# Patient Record
Sex: Female | Born: 1945 | Race: White | Hispanic: No | Marital: Married | Smoking: Never smoker
Health system: Southern US, Community
[De-identification: ages and names within clinical notes are randomized; demographics above are authoritative.]

## PROBLEM LIST (undated history)

## (undated) DIAGNOSIS — M069 Rheumatoid arthritis, unspecified: Secondary | ICD-10-CM

## (undated) DIAGNOSIS — K219 Gastro-esophageal reflux disease without esophagitis: Secondary | ICD-10-CM

## (undated) HISTORY — DX: Gastro-esophageal reflux disease without esophagitis: K21.9

## (undated) HISTORY — DX: Rheumatoid arthritis, unspecified: M06.9

---

## 1978-08-20 HISTORY — PX: ABDOMINAL HYSTERECTOMY: SHX81

## 1986-08-20 HISTORY — PX: LUMBAR DISC SURGERY: SHX700

## 1992-08-20 HISTORY — PX: SPINAL FUSION: SHX223

## 1999-08-24 ENCOUNTER — Encounter: Admission: RE | Admit: 1999-08-24 | Discharge: 1999-08-24 | Payer: Self-pay | Admitting: Family Medicine

## 1999-08-24 ENCOUNTER — Encounter: Payer: Self-pay | Admitting: Family Medicine

## 2001-03-06 ENCOUNTER — Other Ambulatory Visit: Admission: RE | Admit: 2001-03-06 | Discharge: 2001-03-06 | Payer: Self-pay | Admitting: Gynecology

## 2001-03-06 ENCOUNTER — Encounter: Payer: Self-pay | Admitting: Family Medicine

## 2001-03-06 ENCOUNTER — Encounter: Admission: RE | Admit: 2001-03-06 | Discharge: 2001-03-06 | Payer: Self-pay | Admitting: Family Medicine

## 2001-07-03 ENCOUNTER — Emergency Department (HOSPITAL_COMMUNITY): Admission: EM | Admit: 2001-07-03 | Discharge: 2001-07-04 | Payer: Self-pay

## 2003-06-04 ENCOUNTER — Encounter: Payer: Self-pay | Admitting: Family Medicine

## 2003-06-04 ENCOUNTER — Encounter: Admission: RE | Admit: 2003-06-04 | Discharge: 2003-06-04 | Payer: Self-pay | Admitting: Family Medicine

## 2003-07-13 ENCOUNTER — Other Ambulatory Visit: Admission: RE | Admit: 2003-07-13 | Discharge: 2003-07-13 | Payer: Self-pay | Admitting: Gynecology

## 2004-07-12 ENCOUNTER — Ambulatory Visit (HOSPITAL_COMMUNITY): Admission: RE | Admit: 2004-07-12 | Discharge: 2004-07-12 | Payer: Self-pay | Admitting: Family Medicine

## 2004-08-01 ENCOUNTER — Other Ambulatory Visit: Admission: RE | Admit: 2004-08-01 | Discharge: 2004-08-01 | Payer: Self-pay | Admitting: Gynecology

## 2005-08-03 ENCOUNTER — Ambulatory Visit (HOSPITAL_COMMUNITY): Admission: RE | Admit: 2005-08-03 | Discharge: 2005-08-03 | Payer: Self-pay | Admitting: Family Medicine

## 2006-08-05 ENCOUNTER — Ambulatory Visit (HOSPITAL_COMMUNITY): Admission: RE | Admit: 2006-08-05 | Discharge: 2006-08-05 | Payer: Self-pay | Admitting: Gynecology

## 2006-12-05 ENCOUNTER — Ambulatory Visit: Payer: Self-pay | Admitting: Gastroenterology

## 2006-12-19 ENCOUNTER — Ambulatory Visit: Payer: Self-pay | Admitting: Gastroenterology

## 2006-12-19 ENCOUNTER — Encounter (INDEPENDENT_AMBULATORY_CARE_PROVIDER_SITE_OTHER): Payer: Self-pay | Admitting: Gastroenterology

## 2007-08-07 ENCOUNTER — Other Ambulatory Visit: Admission: RE | Admit: 2007-08-07 | Discharge: 2007-08-07 | Payer: Self-pay | Admitting: Gynecology

## 2007-08-07 ENCOUNTER — Ambulatory Visit (HOSPITAL_COMMUNITY): Admission: RE | Admit: 2007-08-07 | Discharge: 2007-08-07 | Payer: Self-pay | Admitting: Gynecology

## 2008-08-16 ENCOUNTER — Ambulatory Visit (HOSPITAL_COMMUNITY): Admission: RE | Admit: 2008-08-16 | Discharge: 2008-08-16 | Payer: Self-pay | Admitting: Gynecology

## 2009-09-30 ENCOUNTER — Ambulatory Visit (HOSPITAL_COMMUNITY): Admission: RE | Admit: 2009-09-30 | Discharge: 2009-09-30 | Payer: Self-pay | Admitting: Gynecology

## 2009-12-05 ENCOUNTER — Encounter (INDEPENDENT_AMBULATORY_CARE_PROVIDER_SITE_OTHER): Payer: Self-pay | Admitting: *Deleted

## 2010-09-19 NOTE — Letter (Signed)
Summary: Colonoscopy Date Change Letter  [facility]  [address] [place], [place] [address]   Phone: [phone]  Fax: [phone]      December 05, 2009 MRN: [id number]   [facility] [address] [place], [place]  [address]   Dear Ms. Guevara,   Previously you were recommended to have a repeat colonoscopy around this time. Your chart was recently reviewed by Dr. Arlyce Dice of [facility]. Follow up colonoscopy is now recommended in 12-2011. This revised recommendation is based on current, nationally recognized guidelines for colorectal cancer screening and polyp surveillance. These guidelines are endorsed by the American Cancer Society, [employer] on Colorectal Cancer as well as numerous other major medical organizations.  Please understand that our recommendation assumes that you do not have any new symptoms such as bleeding, a change in bowel habits, anemia, or significant abdominal discomfort. If you do have any concerning GI symptoms or want to discuss the guideline recommendations, please call to arrange an office visit at your earliest convenience. Otherwise we will keep you in our reminder system and contact you 1-2 months prior to the date listed above to schedule your next colonoscopy.  Thank you,  Barbette Hair. Arlyce Dice, M.D.  [facility] [phone]

## 2011-03-14 ENCOUNTER — Other Ambulatory Visit (HOSPITAL_COMMUNITY): Payer: Self-pay | Admitting: Family Medicine

## 2011-03-14 ENCOUNTER — Other Ambulatory Visit (HOSPITAL_COMMUNITY): Payer: Self-pay | Admitting: Gynecology

## 2011-03-14 DIAGNOSIS — Z1231 Encounter for screening mammogram for malignant neoplasm of breast: Secondary | ICD-10-CM

## 2011-04-10 ENCOUNTER — Ambulatory Visit (HOSPITAL_COMMUNITY)
Admission: RE | Admit: 2011-04-10 | Discharge: 2011-04-10 | Disposition: A | Discharge status: Home / Self Care | Payer: Medicare Other | Source: Ambulatory Visit | Attending: Gynecology | Admitting: Gynecology

## 2011-04-10 ENCOUNTER — Ambulatory Visit (HOSPITAL_COMMUNITY): Payer: BC Managed Care – PPO

## 2011-04-10 DIAGNOSIS — Z1231 Encounter for screening mammogram for malignant neoplasm of breast: Secondary | ICD-10-CM

## 2011-11-06 ENCOUNTER — Encounter: Payer: Self-pay | Admitting: Gastroenterology

## 2012-04-23 ENCOUNTER — Other Ambulatory Visit (HOSPITAL_COMMUNITY): Payer: Self-pay | Admitting: Gynecology

## 2012-04-23 DIAGNOSIS — Z1231 Encounter for screening mammogram for malignant neoplasm of breast: Secondary | ICD-10-CM

## 2012-05-09 ENCOUNTER — Ambulatory Visit (HOSPITAL_COMMUNITY): Payer: Medicare Other

## 2012-05-16 ENCOUNTER — Ambulatory Visit (HOSPITAL_COMMUNITY)
Admission: RE | Admit: 2012-05-16 | Discharge: 2012-05-16 | Disposition: A | Discharge status: Home / Self Care | Payer: Medicare Other | Source: Ambulatory Visit | Attending: Gynecology | Admitting: Gynecology

## 2012-05-16 DIAGNOSIS — Z1231 Encounter for screening mammogram for malignant neoplasm of breast: Secondary | ICD-10-CM | POA: Insufficient documentation

## 2012-11-25 ENCOUNTER — Encounter: Payer: Self-pay | Admitting: Gastroenterology

## 2013-05-06 ENCOUNTER — Other Ambulatory Visit (HOSPITAL_COMMUNITY): Payer: Self-pay | Admitting: Family Medicine

## 2013-05-06 ENCOUNTER — Encounter: Payer: Self-pay | Admitting: Gastroenterology

## 2013-05-06 DIAGNOSIS — Z1231 Encounter for screening mammogram for malignant neoplasm of breast: Secondary | ICD-10-CM

## 2013-05-28 ENCOUNTER — Ambulatory Visit (AMBULATORY_SURGERY_CENTER): Payer: Self-pay | Admitting: *Deleted

## 2013-05-28 ENCOUNTER — Ambulatory Visit (HOSPITAL_COMMUNITY)
Admission: RE | Admit: 2013-05-28 | Discharge: 2013-05-28 | Disposition: A | Discharge status: Home / Self Care | Payer: Medicare Other | Source: Ambulatory Visit | Attending: Family Medicine | Admitting: Family Medicine

## 2013-05-28 VITALS — Ht 63.0 in | Wt 176.4 lb

## 2013-05-28 DIAGNOSIS — Z1231 Encounter for screening mammogram for malignant neoplasm of breast: Secondary | ICD-10-CM | POA: Insufficient documentation

## 2013-05-28 DIAGNOSIS — Z86010 Personal history of colonic polyps: Secondary | ICD-10-CM

## 2013-05-28 MED ORDER — NA SULFATE-K SULFATE-MG SULF 17.5-3.13-1.6 GM/177ML PO SOLN: 1.0000 | Freq: Once | ORAL | Status: DC

## 2013-05-28 NOTE — Progress Notes (Signed)
No allergies to eggs or soy. No problems with anesthesia.  

## 2013-05-29 ENCOUNTER — Encounter: Payer: Self-pay | Admitting: Gastroenterology

## 2013-06-10 ENCOUNTER — Ambulatory Visit (AMBULATORY_SURGERY_CENTER): Payer: Medicare Other | Admitting: Gastroenterology

## 2013-06-10 ENCOUNTER — Encounter: Payer: Self-pay | Admitting: Gastroenterology

## 2013-06-10 VITALS — BP 143/60 | HR 61 | Temp 97.7°F | Resp 20 | Ht 63.0 in | Wt 176.0 lb

## 2013-06-10 DIAGNOSIS — K5730 Diverticulosis of large intestine without perforation or abscess without bleeding: Secondary | ICD-10-CM

## 2013-06-10 DIAGNOSIS — D126 Benign neoplasm of colon, unspecified: Secondary | ICD-10-CM

## 2013-06-10 DIAGNOSIS — Z86010 Personal history of colonic polyps: Secondary | ICD-10-CM

## 2013-06-10 MED ORDER — SODIUM CHLORIDE 0.9 % IV SOLN: 500.0000 mL | INTRAVENOUS | Status: DC

## 2013-06-10 NOTE — Progress Notes (Signed)
Called to room to assist during endoscopic procedure.  Patient ID and intended procedure confirmed with present staff. Received instructions for my participation in the procedure from the performing physician.  

## 2013-06-10 NOTE — Progress Notes (Signed)
Report to pacu rn, vss, bbs=clear 

## 2013-06-10 NOTE — Progress Notes (Signed)
Patient did not experience any of the following events: a burn prior to discharge; a fall within the facility; wrong site/side/patient/procedure/implant event; or a hospital transfer or hospital admission upon discharge from the facility. (G8907) Patient did not have preoperative order for IV antibiotic SSI prophylaxis. (G8918)  

## 2013-06-10 NOTE — Patient Instructions (Signed)
Colon polyp removed,and diverticulosis seen. Handouts given on polyps & diverticulosis.  Resume current medications.  Call us with any questions or concerns. Thank you!!  YOU HAD AN ENDOSCOPIC PROCEDURE TODAY AT THE  ENDOSCOPY CENTER: Refer to the procedure report that was given to you for any specific questions about what was found during the examination.  If the procedure report does not answer your questions, please call your gastroenterologist to clarify.  If you requested that your care partner not be given the details of your procedure findings, then the procedure report has been included in a sealed envelope for you to review at your convenience later.  YOU SHOULD EXPECT: Some feelings of bloating in the abdomen. Passage of more gas than usual.  Walking can help get rid of the air that was put into your GI tract during the procedure and reduce the bloating. If you had a lower endoscopy (such as a colonoscopy or flexible sigmoidoscopy) you may notice spotting of blood in your stool or on the toilet paper. If you underwent a bowel prep for your procedure, then you may not have a normal bowel movement for a few days.  DIET: Your first meal following the procedure should be a light meal and then it is ok to progress to your normal diet.  A half-sandwich or bowl of soup is an example of a good first meal.  Heavy or fried foods are harder to digest and may make you feel nauseous or bloated.  Likewise meals heavy in dairy and vegetables can cause extra gas to form and this can also increase the bloating.  Drink plenty of fluids but you should avoid alcoholic beverages for 24 hours.  ACTIVITY: Your care partner should take you home directly after the procedure.  You should plan to take it easy, moving slowly for the rest of the day.  You can resume normal activity the day after the procedure however you should NOT DRIVE or use heavy machinery for 24 hours (because of the sedation medicines used  during the test).    SYMPTOMS TO REPORT IMMEDIATELY: A gastroenterologist can be reached at any hour.  During normal business hours, 8:30 AM to 5:00 PM Monday through Friday, call [phone].  After hours and on weekends, please call the GI answering service at [phone] who will take a message and have the physician on call contact you.   Following lower endoscopy (colonoscopy or flexible sigmoidoscopy):  Excessive amounts of blood in the stool  Significant tenderness or worsening of abdominal pains  Swelling of the abdomen that is new, acute  Fever of 100F or higher  Following upper endoscopy (EGD)  Vomiting of blood or coffee ground material  New chest pain or pain under the shoulder blades  Painful or persistently difficult swallowing  New shortness of breath  Fever of 100F or higher  Black, tarry-looking stools  FOLLOW UP: If any biopsies were taken you will be contacted by phone or by letter within the next 1-3 weeks.  Call your gastroenterologist if you have not heard about the biopsies in 3 weeks.  Our staff will call the home number listed on your records the next business day following your procedure to check on you and address any questions or concerns that you may have at that time regarding the information given to you following your procedure. This is a courtesy call and so if there is no answer at the home number and we have not heard from you  through the emergency physician on call, we will assume that you have returned to your regular daily activities without incident.  SIGNATURES/CONFIDENTIALITY: You and/or your care partner have signed paperwork which will be entered into your electronic medical record.  These signatures attest to the fact that that the information above on your After Visit Summary has been reviewed and is understood.  Full responsibility of the confidentiality of this discharge information lies with you and/or your care-partner.

## 2013-06-10 NOTE — Op Note (Signed)
[facility] [address] [place], [address]   COLONOSCOPY PROCEDURE REPORT  PATIENT: Vickie White, Vickie White  MR#: [id number] BIRTHDATE: 01/16/1946 , 67  yrs. old GENDER: Female ENDOSCOPIST: Louis Meckel, MD REFERRED BY: PROCEDURE DATE:  06/10/2013 PROCEDURE:   Colonoscopy with cold biopsy polypectomy First Screening Colonoscopy - Avg.  risk and is 50 yrs.  old or older - No.  Prior Negative Screening - Now for repeat screening. N/A  History of Adenoma - Now for follow-up colonoscopy & has been > or = to 3 yrs.  Yes hx of adenoma.  Has been 3 or more years since last colonoscopy.  Polyps Removed Today? Yes. ASA CLASS:   Class II INDICATIONS:Patient's personal history of adenomatous colon polyps. 2009 MEDICATIONS: MAC sedation, administered by CRNA and Propofol (Diprivan) 230 mg IV  DESCRIPTION OF PROCEDURE:   After the risks benefits and alternatives of the procedure were thoroughly explained, informed consent was obtained.  A digital rectal exam revealed no abnormalities of the rectum.   The LB [id number]  endoscope was introduced through the anus and advanced to the cecum, which was identified by both the appendix and ileocecal valve. No adverse events experienced.   The quality of the prep was Suprep good  The instrument was then slowly withdrawn as the colon was fully examined.      COLON FINDINGS: A sessile polyp measuring 2 mm (versus inverted diverticulum) in size was found at the cecum.  A polypectomy was performed with cold forceps.   Moderate diverticulosis was noted in the sigmoid colon.   The colon mucosa was otherwise normal. Retroflexed views revealed no abnormalities. The time to cecum=3 minutes 22 seconds.  Withdrawal time=8 minutes 09 seconds.  The scope was withdrawn and the procedure completed. COMPLICATIONS: There were no complications.  ENDOSCOPIC IMPRESSION: 1.   Sessile polyp measuring 2 mm in size was found at the  cecum; polypectomy was performed with cold forceps 2.   Moderate diverticulosis was noted in the sigmoid colon 3.   The colon mucosa was otherwise normal  RECOMMENDATIONS: If the polyp(s) removed today are proven to be adenomatous (pre-cancerous) polyps, you will need a repeat colonoscopy in 5 years.  Otherwise you should continue to follow colorectal cancer screening guidelines for "routine risk" patients with colonoscopy in remark7 years.  You will receive a letter within 1-2 weeks with the results of your biopsy as well as final recommendations. Please call my office if you have not received a letter after 3 weeks.   eSigned:  Louis Meckel, MD 06/10/2013 2:04 PM   cc: Marlise Eves, NP   PATIENT NAME:  Vickie White, Vickie White MR#: [id number]

## 2013-06-11 ENCOUNTER — Telehealth: Payer: Self-pay

## 2013-06-11 NOTE — Telephone Encounter (Signed)
  Follow up Call-  Call back number 06/10/2013  Post procedure Call Back phone  # [phone]  Permission to leave phone message Yes     Patient questions:  Do you have a fever, pain , or abdominal swelling? no Pain Score  0 *  Have you tolerated food without any problems? yes  Have you been able to return to your normal activities? yes  Do you have any questions about your discharge instructions: Diet   no Medications  no Follow up visit  no  Do you have questions or concerns about your Care? no  Actions: * If pain score is 4 or above: No action needed, pain <4.  Per the pt "you guys have the best staff, everyone was wonderful"  No problems noted. Maw

## 2013-06-16 ENCOUNTER — Encounter: Payer: Self-pay | Admitting: Gastroenterology

## 2015-12-26 ENCOUNTER — Other Ambulatory Visit: Payer: Self-pay

## 2015-12-26 DIAGNOSIS — Z1231 Encounter for screening mammogram for malignant neoplasm of breast: Secondary | ICD-10-CM

## 2015-12-29 ENCOUNTER — Ambulatory Visit: Payer: Self-pay

## 2016-01-09 ENCOUNTER — Ambulatory Visit
Admission: RE | Admit: 2016-01-09 | Discharge: 2016-01-09 | Disposition: A | Discharge status: Home / Self Care | Payer: Medicare Other | Source: Ambulatory Visit

## 2016-01-09 DIAGNOSIS — Z1231 Encounter for screening mammogram for malignant neoplasm of breast: Secondary | ICD-10-CM

## 2018-06-16 ENCOUNTER — Encounter: Payer: Self-pay | Admitting: Gastroenterology

## 2018-08-18 ENCOUNTER — Other Ambulatory Visit: Payer: Self-pay | Admitting: Orthopedic Surgery

## 2018-08-18 DIAGNOSIS — M25561 Pain in right knee: Secondary | ICD-10-CM

## 2018-08-24 ENCOUNTER — Ambulatory Visit
Admission: RE | Admit: 2018-08-24 | Discharge: 2018-08-24 | Disposition: A | Discharge status: Home / Self Care | Payer: Medicare Other | Source: Ambulatory Visit | Attending: Orthopedic Surgery | Admitting: Orthopedic Surgery

## 2018-08-24 DIAGNOSIS — M25561 Pain in right knee: Secondary | ICD-10-CM

## 2018-08-30 ENCOUNTER — Other Ambulatory Visit: Payer: Medicare Other

## 2018-10-17 ENCOUNTER — Emergency Department (HOSPITAL_COMMUNITY)
Admission: EM | Admit: 2018-10-17 | Discharge: 2018-10-17 | Disposition: A | Discharge status: Home / Self Care | Payer: Medicare Other | Attending: Emergency Medicine | Admitting: Emergency Medicine

## 2018-10-17 ENCOUNTER — Emergency Department (HOSPITAL_COMMUNITY): Payer: Medicare Other

## 2018-10-17 DIAGNOSIS — S4992XA Unspecified injury of left shoulder and upper arm, initial encounter: Secondary | ICD-10-CM | POA: Diagnosis present

## 2018-10-17 DIAGNOSIS — W01198A Fall on same level from slipping, tripping and stumbling with subsequent striking against other object, initial encounter: Secondary | ICD-10-CM | POA: Diagnosis not present

## 2018-10-17 DIAGNOSIS — Y998 Other external cause status: Secondary | ICD-10-CM | POA: Diagnosis not present

## 2018-10-17 DIAGNOSIS — W19XXXA Unspecified fall, initial encounter: Secondary | ICD-10-CM

## 2018-10-17 DIAGNOSIS — Y9234 Swimming pool (public) as the place of occurrence of the external cause: Secondary | ICD-10-CM | POA: Diagnosis not present

## 2018-10-17 DIAGNOSIS — Y9389 Activity, other specified: Secondary | ICD-10-CM | POA: Diagnosis not present

## 2018-10-17 DIAGNOSIS — S42202A Unspecified fracture of upper end of left humerus, initial encounter for closed fracture: Secondary | ICD-10-CM | POA: Diagnosis not present

## 2018-10-17 DIAGNOSIS — S52025A Nondisplaced fracture of olecranon process without intraarticular extension of left ulna, initial encounter for closed fracture: Secondary | ICD-10-CM

## 2018-10-17 MED ORDER — ONDANSETRON HCL 4 MG/2ML IJ SOLN
4.0000 mg | Freq: Once | INTRAMUSCULAR | Status: AC
  Administered 2018-10-17: 4 mg via INTRAVENOUS
  Filled 2018-10-17: qty 2

## 2018-10-17 MED ORDER — HYDROCODONE-ACETAMINOPHEN 5-325 MG PO TABS
1.0000 | ORAL_TABLET | Freq: Once | ORAL | Status: AC
  Administered 2018-10-17: 1 via ORAL
  Filled 2018-10-17: qty 1

## 2018-10-17 MED ORDER — NAPROXEN 500 MG PO TABS: 500.0000 mg | ORAL_TABLET | Freq: Two times a day (BID) | ORAL | 0 refills | Status: DC | PRN

## 2018-10-17 MED ORDER — MORPHINE SULFATE (PF) 4 MG/ML IV SOLN
4.0000 mg | Freq: Once | INTRAVENOUS | Status: AC
  Administered 2018-10-17: 4 mg via INTRAVENOUS
  Filled 2018-10-17: qty 1

## 2018-10-17 MED ORDER — HYDROCODONE-ACETAMINOPHEN 5-325 MG PO TABS: 1.0000 | ORAL_TABLET | Freq: Four times a day (QID) | ORAL | 0 refills | Status: DC | PRN

## 2018-10-17 MED ORDER — FENTANYL CITRATE (PF) 100 MCG/2ML IJ SOLN
50.0000 ug | Freq: Once | INTRAMUSCULAR | Status: AC
  Administered 2018-10-17: 50 ug via INTRAVENOUS
  Filled 2018-10-17: qty 2

## 2018-10-17 NOTE — Discharge Instructions (Addendum)
Wear splint and sling at all times until you see the orthopedist. Ice and elevate the elbow and shoulder throughout the day, using ice pack for no more than 20 minutes every hour.  Alternate between naprosyn and norco as directed as needed for pain relief. Do not drive or operate machinery with pain medication use. Follow up with Dr. Tamera Punt this week for ongoing management of your elbow and shoulder fractures. Return to the ER for changes or worsening symptoms.

## 2018-10-17 NOTE — ED Notes (Signed)
Patient verbalizes understanding of discharge instructions. Opportunity for questioning and answers were provided. Armband removed by staff, pt discharged from ED.  

## 2018-10-17 NOTE — ED Provider Notes (Signed)
[facility] EMER[facility]ENCY DEPARTMENT Provider Note   CSN: [id number] Arrival date & time: 10/17/18  1134    History   Chief Complaint Chief Complaint  Patient presents with  . Fall    HPI    Vickie White is a 73 y.o. female with a PMHx of GERD and RA, who presents to the ED with complaints of mechanical fall at the [facility].  Patient states that she got out of the pool, and slipped on the deck, landing on her left elbow and jamming her left shoulder up.  This occurred around 10 AM.  She did not hit her head or lose consciousness, she is not on any blood thinners.  She now complains of 7/10 constant sharp nonradiating left elbow and shoulder pain that worsens with movement and has been improved with fentanyl.  She has intermittent tingling in her left hand that is dependent on positioning.  Her PCP is Dr. Micheal Likens at in [facility].  She sees Dr. Mayer Camel of [place].  She last ate around 8 AM.  She denies any headache, vision changes, lightheadedness, prodromal symptoms, chest pain, shortness breath, abdominal pain, nausea, vomiting, neck or back pain, any other injuries or pain anywhere else, numbness, focal weakness, or any other complaints at this time.  The history is provided by the patient and medical records. No language interpreter was used.  Fall  Pertinent negatives include no chest pain, no abdominal pain, no headaches and no shortness of breath.    Past Medical History:  Diagnosis Date  . GERD (gastroesophageal reflux disease)   . Rheumatoid arthritis     There are no active problems to display for this patient.   Past Surgical History:  Procedure Laterality Date  . ABDOMINAL HYSTERECTOMY  1980  . [place]  . SPINAL FUSION  1994   L4 and L5     OB History   No obstetric history on file.      Home Medications    Prior to Admission medications   Medication Sig Start Date End Date Taking? Authorizing Provider  Multiple  Vitamin (MULTIVITAMIN) tablet Take 1 tablet by mouth daily.    [provider]    Family History Family History  Problem Relation Age of Onset  . Colon cancer Neg Hx     Social History Social History   Tobacco Use  . Smoking status: Never Smoker  . Smokeless tobacco: Never Used  Substance Use Topics  . Alcohol use: No  . Drug use: No     Allergies   Demerol [meperidine] and Penicillins   Review of Systems Review of Systems  HENT: Negative for facial swelling (no head inj).   Respiratory: Negative for shortness of breath.   Cardiovascular: Negative for chest pain.  Gastrointestinal: Negative for abdominal pain, nausea and vomiting.  Musculoskeletal: Positive for arthralgias. Negative for back pain and neck pain.  Allergic/Immunologic: Negative for immunocompromised state.  Neurological: Negative for syncope, weakness, light-headedness, numbness and headaches.  Hematological: Does not bruise/bleed easily.  Psychiatric/Behavioral: Negative for confusion.   All other systems reviewed and are negative for acute change except as noted in the HPI.    Physical Exam Updated Vital Signs BP (!) 155/77 (BP Location: Right Arm)   Pulse 75   Temp (!) 97.5 F (36.4 C) (Oral)   Resp 16   SpO2 100%   Physical Exam Vitals signs and nursing note reviewed.  Constitutional:      General:  She is not in acute distress.    Appearance: Normal appearance. She is well-developed. She is not toxic-appearing.     Comments: Afebrile, nontoxic, NAD  HENT:     Head: Normocephalic and atraumatic.  Eyes:     General:        Right eye: No discharge.        Left eye: No discharge.     Conjunctiva/sclera: Conjunctivae normal.  Neck:     Musculoskeletal: Normal range of motion and neck supple. Normal range of motion. No neck rigidity, spinous process tenderness or muscular tenderness.     Comments: FROM intact without spinous process TTP, no bony stepoffs or deformities, no  paraspinous muscle TTP or muscle spasms. No rigidity or meningeal signs. No bruising or swelling.  Cardiovascular:     Rate and Rhythm: Normal rate and regular rhythm.     Pulses: Normal pulses.     Heart sounds: Normal heart sounds, S1 normal and S2 normal. No murmur. No friction rub. No gallop.   Pulmonary:     Effort: Pulmonary effort is normal. No respiratory distress.     Breath sounds: Normal breath sounds. No decreased breath sounds, wheezing, rhonchi or rales.  Abdominal:     General: Bowel sounds are normal. There is no distension.     Palpations: Abdomen is soft. Abdomen is not rigid.     Tenderness: There is no abdominal tenderness. There is no right CVA tenderness, left CVA tenderness, guarding or rebound. Negative signs include Murphy's sign and McBurney's sign.  Musculoskeletal:     Left shoulder: She exhibits decreased range of motion (due to pain), tenderness and bony tenderness. She exhibits no crepitus, no deformity, normal pulse and normal strength.     Left elbow: She exhibits decreased range of motion. Tenderness found.     Left wrist: Normal.     Comments: No spinal tenderness.  Left shoulder with limited ROM due to pain, diffuse tenderness throughout the proximal humerus, no definite crepitus or deformity although limited exam due to patient not moving the shoulder.  No definite bruising or significant swelling noted.  Moderate tenderness at the elbow diffusely throughout the joint, no swelling or crepitus, no deformity, no bruising.  Limited ROM of the elbow as well.  No tenderness to the forearm or wrist, no tenderness to the hand.  Grip strength and sensation grossly intact, distal pulses intact, soft compartments. No tenderness to the pelvis/hips or lower extremities.   Skin:    General: Skin is warm and dry.     Findings: No rash.  Neurological:     Mental Status: She is alert and oriented to person, place, and time.     Sensory: Sensation is intact. No sensory  deficit.     Motor: Motor function is intact.  Psychiatric:        Mood and Affect: Mood and affect normal.        Behavior: Behavior normal.      ED Treatments / Results  Labs (all labs ordered are listed, but only abnormal results are displayed) Labs Reviewed - No data to display  EKG None  Radiology Dg Chest 1 View  Result Date: 10/17/2018 CLINICAL DATA:  Slipped and fell in water at the [facility]. Pt said she fell on her left elbow and it felt like her humerus "gave way". C/o pain to left elbow, humerus, and left shoulder. Patient's left arm is in an immobilizer, patient unable to move arm. Nonsmoker. Hx of rheumatoid  arthritis. EXAM: CHEST  1 VIEW COMPARISON:  None. FINDINGS: Cardiac silhouette is normal in size. No mediastinal or hilar masses. No evidence of adenopathy. Clear lungs.  No pleural effusion or pneumothorax. Skeletal structures are demineralized. Acute proximal left humeral fracture is again noted. IMPRESSION: No acute cardiopulmonary disease. Electronically Signed   By: Lajean Manes M.D.   On: 10/17/2018 13:14   Dg Elbow Complete Left  Result Date: 10/17/2018 CLINICAL DATA:  Slipped and fell in water at the [facility]. Pt said she fell on her left elbow and it felt like her humerus "gave way". C/o pain to left elbow, humerus, and left shoulder. Patient's left arm is in an immobilizer, patient unable to move arm. Nonsmoker. Hx of rheumatoid arthritis. EXAM: LEFT ELBOW - COMPLETE 3+ VIEW COMPARISON:  None. FINDINGS: There is a fracture of the ulna, across the base of the olecranon, without significant displacement or comminution. No other evidence of a fracture.  Elbow joint is normally aligned. Probable joint effusion. Soft tissues are unremarkable. IMPRESSION: Fracture of the ulna at the base of the olecranon without significant displacement. No dislocation. Electronically Signed   By: Lajean Manes M.D.   On: 10/17/2018 13:13   Dg Shoulder Left  Result Date: 10/17/2018 CLINICAL  DATA:  Slipped and fell in water at the [facility]. Pt said she fell on her left elbow and it felt like her humerus "gave way". C/o pain to left elbow, humerus, and left shoulder. Patient's left arm is in an immobilizer, patient unable to move arm. Nonsmoker. Hx of rheumatoid arthritis. EXAM: LEFT SHOULDER - 2+ VIEW COMPARISON:  None. FINDINGS: There is a comminuted displaced fracture of the proximal humerus. There is a primary transverse fracture across the metaphysis with secondary fractures across the greater tuberosity. The shaft fracture component has displaced superiorly in relation to the humeral head by 2 cm. Is also varus rotation between the humeral head and shaft components. Glenohumeral joint remains normally aligned. AC joint is normally aligned. There are no other fractures. No bone lesions. IMPRESSION: 1. Comminuted, displaced fracture of the proximal left humerus as described. No dislocation. Electronically Signed   By: Lajean Manes M.D.   On: 10/17/2018 13:12   Dg Humerus Left  Result Date: 10/17/2018 CLINICAL DATA:  Slipped and fell in water at the [facility]. Pt said she fell on her left elbow and it felt like her humerus "gave way". C/o pain to left elbow, humerus, and left shoulder. Patient's left arm is in an immobilizer, patient unable to move arm. Nonsmoker. Hx of rheumatoid arthritis. EXAM: LEFT HUMERUS - 2+ VIEW COMPARISON:  None. FINDINGS: There is a comminuted displaced fracture of the proximal humerus. No other humeral fracture. Elbow and shoulder joints are normally aligned. IMPRESSION: Comminuted displaced fracture of the proximal left humerus. No dislocation. Electronically Signed   By: Lajean Manes M.D.   On: 10/17/2018 13:11    Procedures Procedures (including critical care time)     Medications Ordered in ED Medications  fentaNYL (SUBLIMAZE) injection 50 mcg (50 mcg Intravenous Given 10/17/18 1216)  morphine 4 MG/ML injection 4 mg (4 mg Intravenous Given 10/17/18 1408)      Initial Impression / Assessment and Plan / ED Course  I have reviewed the triage vital signs and the nursing notes.  Pertinent labs & imaging results that were available during my care of the patient were reviewed by me and considered in my medical decision making (see chart for details).  73 y.o. female here with mechanical fall, now with left elbow and shoulder pain.  On exam, diffuse left shoulder and upper arm tenderness with some left elbow tenderness, no forearm or wrist tenderness, extremity neurovascularly intact with soft compartments.  X-rays pending.  Will reassess shortly.  Discussed case with my attending Dr. Ralene Bathe who agrees with plan.  1:19 PM Xrays show comminuted displaced fx of proximal L humerus and fracture of ulna at base of olecranon without significant displacement. Spoke with Silvestre Gunner PA-C for orthopedics who will see pt and advise further  2:11 PM Silvestre Gunner PA-C of ortho coming by and advising that she will need arm splint and sling, and f/up with Dr. Tamera Punt this week in the office. Will send home with pain meds. Advised RICE, f/up with ortho this week. I explained the diagnosis and have given explicit precautions to return to the ER including for any other new or worsening symptoms. The patient understands and accepts the medical plan as it's been dictated and I have answered their questions. Discharge instructions concerning home care and prescriptions have been given. The patient is STABLE and is discharged to home in good condition.    Final Clinical Impressions(s) / ED Diagnoses   Final diagnoses:  Closed fracture of proximal end of left humerus, unspecified fracture morphology, initial encounter  Closed nondisplaced fracture of olecranon process of left ulna without intra-articular extension, initial encounter  Fall, initial encounter    ED Discharge Orders         Ordered    naproxen (NAPROSYN) 500 MG tablet  2 times daily PRN      10/17/18 1406    HYDROcodone-acetaminophen (NORCO) 5-325 MG tablet  Every 6 hours PRN     10/17/18 [address], [place], [place] 10/17/18 1418    Quintella Reichert, MD 10/19/18 0900

## 2018-10-17 NOTE — ED Notes (Signed)
Ortho at bedside.

## 2018-10-17 NOTE — ED Triage Notes (Signed)
Pt arrived via [facility] from the [facility] where pt slipped on the deck in the swimming pool area. Pt was walking from pool to hot tub when she fell. Pt denies LOC or striking head. Pt c/o left upper arm and shoulder pain. Arm splinted by EMS. EMS gave 158mcgs fentenyl PTA @10 :50am. VSS. 7/10 pain in left arm at this time.
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
## 2020-01-14 DIAGNOSIS — M069 Rheumatoid arthritis, unspecified: Secondary | ICD-10-CM | POA: Diagnosis not present

## 2020-01-14 DIAGNOSIS — Z139 Encounter for screening, unspecified: Secondary | ICD-10-CM | POA: Diagnosis not present

## 2020-01-14 DIAGNOSIS — Z6823 Body mass index (BMI) 23.0-23.9, adult: Secondary | ICD-10-CM | POA: Diagnosis not present

## 2020-01-14 DIAGNOSIS — Z1331 Encounter for screening for depression: Secondary | ICD-10-CM | POA: Diagnosis not present

## 2020-01-14 DIAGNOSIS — Z9181 History of falling: Secondary | ICD-10-CM | POA: Diagnosis not present

## 2020-02-05 DIAGNOSIS — K529 Noninfective gastroenteritis and colitis, unspecified: Secondary | ICD-10-CM

## 2020-02-05 DIAGNOSIS — I491 Atrial premature depolarization: Secondary | ICD-10-CM | POA: Diagnosis not present

## 2020-02-05 DIAGNOSIS — R0789 Other chest pain: Secondary | ICD-10-CM | POA: Diagnosis not present

## 2020-02-05 DIAGNOSIS — R030 Elevated blood-pressure reading, without diagnosis of hypertension: Secondary | ICD-10-CM | POA: Diagnosis not present

## 2020-02-05 DIAGNOSIS — I161 Hypertensive emergency: Secondary | ICD-10-CM | POA: Diagnosis not present

## 2020-02-05 DIAGNOSIS — R7989 Other specified abnormal findings of blood chemistry: Secondary | ICD-10-CM | POA: Diagnosis not present

## 2020-02-05 DIAGNOSIS — R197 Diarrhea, unspecified: Secondary | ICD-10-CM | POA: Diagnosis not present

## 2020-02-05 DIAGNOSIS — Z8719 Personal history of other diseases of the digestive system: Secondary | ICD-10-CM | POA: Diagnosis not present

## 2020-02-05 DIAGNOSIS — M069 Rheumatoid arthritis, unspecified: Secondary | ICD-10-CM | POA: Diagnosis not present

## 2020-02-05 DIAGNOSIS — R072 Precordial pain: Secondary | ICD-10-CM | POA: Diagnosis not present

## 2020-02-05 DIAGNOSIS — I10 Essential (primary) hypertension: Secondary | ICD-10-CM | POA: Diagnosis not present

## 2020-02-05 DIAGNOSIS — Z885 Allergy status to narcotic agent status: Secondary | ICD-10-CM | POA: Diagnosis not present

## 2020-02-05 DIAGNOSIS — I493 Ventricular premature depolarization: Secondary | ICD-10-CM | POA: Diagnosis not present

## 2020-02-05 DIAGNOSIS — I160 Hypertensive urgency: Secondary | ICD-10-CM

## 2020-02-05 DIAGNOSIS — R791 Abnormal coagulation profile: Secondary | ICD-10-CM | POA: Diagnosis not present

## 2020-02-05 DIAGNOSIS — R079 Chest pain, unspecified: Secondary | ICD-10-CM | POA: Diagnosis not present

## 2020-02-05 DIAGNOSIS — R1111 Vomiting without nausea: Secondary | ICD-10-CM | POA: Diagnosis not present

## 2020-02-05 DIAGNOSIS — Z880 Allergy status to penicillin: Secondary | ICD-10-CM | POA: Diagnosis not present

## 2020-02-05 HISTORY — DX: Hypertensive urgency: I16.0

## 2020-02-05 HISTORY — DX: Noninfective gastroenteritis and colitis, unspecified: K52.9

## 2020-02-05 HISTORY — DX: Chest pain, unspecified: R07.9

## 2020-04-14 ENCOUNTER — Other Ambulatory Visit: Payer: Self-pay | Admitting: Nurse Practitioner

## 2020-04-14 DIAGNOSIS — Z1231 Encounter for screening mammogram for malignant neoplasm of breast: Secondary | ICD-10-CM

## 2020-04-14 DIAGNOSIS — E559 Vitamin D deficiency, unspecified: Secondary | ICD-10-CM | POA: Diagnosis not present

## 2020-04-14 DIAGNOSIS — Z0000 Encounter for general adult medical examination without abnormal findings: Secondary | ICD-10-CM | POA: Diagnosis not present

## 2020-04-14 DIAGNOSIS — E785 Hyperlipidemia, unspecified: Secondary | ICD-10-CM | POA: Diagnosis not present

## 2020-05-04 ENCOUNTER — Other Ambulatory Visit: Payer: Self-pay

## 2020-05-04 ENCOUNTER — Ambulatory Visit
Admission: RE | Admit: 2020-05-04 | Discharge: 2020-05-04 | Disposition: A | Discharge status: Home / Self Care | Payer: Medicare PPO | Source: Ambulatory Visit | Attending: Nurse Practitioner | Admitting: Nurse Practitioner

## 2020-05-04 DIAGNOSIS — Z1231 Encounter for screening mammogram for malignant neoplasm of breast: Secondary | ICD-10-CM | POA: Diagnosis not present

## 2020-06-10 DIAGNOSIS — M791 Myalgia, unspecified site: Secondary | ICD-10-CM | POA: Diagnosis not present

## 2020-06-10 DIAGNOSIS — D649 Anemia, unspecified: Secondary | ICD-10-CM | POA: Diagnosis not present

## 2020-06-10 DIAGNOSIS — M150 Primary generalized (osteo)arthritis: Secondary | ICD-10-CM | POA: Diagnosis not present

## 2020-06-10 DIAGNOSIS — Z6824 Body mass index (BMI) 24.0-24.9, adult: Secondary | ICD-10-CM | POA: Diagnosis not present

## 2020-06-10 DIAGNOSIS — M25561 Pain in right knee: Secondary | ICD-10-CM | POA: Diagnosis not present

## 2020-06-10 DIAGNOSIS — M0579 Rheumatoid arthritis with rheumatoid factor of multiple sites without organ or systems involvement: Secondary | ICD-10-CM | POA: Diagnosis not present

## 2020-07-25 DIAGNOSIS — H524 Presbyopia: Secondary | ICD-10-CM | POA: Diagnosis not present

## 2020-07-25 DIAGNOSIS — H25813 Combined forms of age-related cataract, bilateral: Secondary | ICD-10-CM | POA: Diagnosis not present

## 2020-09-19 DIAGNOSIS — M25561 Pain in right knee: Secondary | ICD-10-CM | POA: Diagnosis not present

## 2020-09-19 DIAGNOSIS — M0579 Rheumatoid arthritis with rheumatoid factor of multiple sites without organ or systems involvement: Secondary | ICD-10-CM | POA: Diagnosis not present

## 2020-09-19 DIAGNOSIS — Z6824 Body mass index (BMI) 24.0-24.9, adult: Secondary | ICD-10-CM | POA: Diagnosis not present

## 2020-09-19 DIAGNOSIS — M150 Primary generalized (osteo)arthritis: Secondary | ICD-10-CM | POA: Diagnosis not present

## 2020-09-19 DIAGNOSIS — M791 Myalgia, unspecified site: Secondary | ICD-10-CM | POA: Diagnosis not present

## 2020-09-19 DIAGNOSIS — D508 Other iron deficiency anemias: Secondary | ICD-10-CM | POA: Diagnosis not present

## 2020-12-21 DIAGNOSIS — M0579 Rheumatoid arthritis with rheumatoid factor of multiple sites without organ or systems involvement: Secondary | ICD-10-CM | POA: Diagnosis not present

## 2020-12-21 DIAGNOSIS — Z6825 Body mass index (BMI) 25.0-25.9, adult: Secondary | ICD-10-CM | POA: Diagnosis not present

## 2020-12-21 DIAGNOSIS — D508 Other iron deficiency anemias: Secondary | ICD-10-CM | POA: Diagnosis not present

## 2020-12-21 DIAGNOSIS — M150 Primary generalized (osteo)arthritis: Secondary | ICD-10-CM | POA: Diagnosis not present

## 2020-12-21 DIAGNOSIS — E663 Overweight: Secondary | ICD-10-CM | POA: Diagnosis not present

## 2020-12-21 DIAGNOSIS — M25561 Pain in right knee: Secondary | ICD-10-CM | POA: Diagnosis not present

## 2020-12-21 DIAGNOSIS — M791 Myalgia, unspecified site: Secondary | ICD-10-CM | POA: Diagnosis not present

## 2021-03-07 DIAGNOSIS — Z6823 Body mass index (BMI) 23.0-23.9, adult: Secondary | ICD-10-CM | POA: Diagnosis not present

## 2021-03-07 DIAGNOSIS — J069 Acute upper respiratory infection, unspecified: Secondary | ICD-10-CM | POA: Diagnosis not present

## 2021-03-23 DIAGNOSIS — M0579 Rheumatoid arthritis with rheumatoid factor of multiple sites without organ or systems involvement: Secondary | ICD-10-CM | POA: Diagnosis not present

## 2021-03-23 DIAGNOSIS — M791 Myalgia, unspecified site: Secondary | ICD-10-CM | POA: Diagnosis not present

## 2021-03-23 DIAGNOSIS — Z6824 Body mass index (BMI) 24.0-24.9, adult: Secondary | ICD-10-CM | POA: Diagnosis not present

## 2021-03-23 DIAGNOSIS — M150 Primary generalized (osteo)arthritis: Secondary | ICD-10-CM | POA: Diagnosis not present

## 2021-03-23 DIAGNOSIS — M25561 Pain in right knee: Secondary | ICD-10-CM | POA: Diagnosis not present

## 2021-03-23 DIAGNOSIS — D508 Other iron deficiency anemias: Secondary | ICD-10-CM | POA: Diagnosis not present

## 2021-06-26 DIAGNOSIS — M25561 Pain in right knee: Secondary | ICD-10-CM | POA: Diagnosis not present

## 2021-06-26 DIAGNOSIS — Z6824 Body mass index (BMI) 24.0-24.9, adult: Secondary | ICD-10-CM | POA: Diagnosis not present

## 2021-06-26 DIAGNOSIS — M791 Myalgia, unspecified site: Secondary | ICD-10-CM | POA: Diagnosis not present

## 2021-06-26 DIAGNOSIS — M0579 Rheumatoid arthritis with rheumatoid factor of multiple sites without organ or systems involvement: Secondary | ICD-10-CM | POA: Diagnosis not present

## 2021-06-26 DIAGNOSIS — D508 Other iron deficiency anemias: Secondary | ICD-10-CM | POA: Diagnosis not present

## 2021-06-26 DIAGNOSIS — M150 Primary generalized (osteo)arthritis: Secondary | ICD-10-CM | POA: Diagnosis not present

## 2021-09-11 ENCOUNTER — Other Ambulatory Visit: Payer: Self-pay | Admitting: Family Medicine

## 2021-09-11 DIAGNOSIS — Z1231 Encounter for screening mammogram for malignant neoplasm of breast: Secondary | ICD-10-CM

## 2021-09-20 ENCOUNTER — Ambulatory Visit
Admission: RE | Admit: 2021-09-20 | Discharge: 2021-09-20 | Disposition: A | Discharge status: Home / Self Care | Payer: Medicare PPO | Source: Ambulatory Visit | Attending: Family Medicine | Admitting: Family Medicine

## 2021-09-20 DIAGNOSIS — Z1231 Encounter for screening mammogram for malignant neoplasm of breast: Secondary | ICD-10-CM

## 2021-10-20 DIAGNOSIS — M25542 Pain in joints of left hand: Secondary | ICD-10-CM | POA: Diagnosis not present

## 2021-10-20 DIAGNOSIS — M25541 Pain in joints of right hand: Secondary | ICD-10-CM | POA: Diagnosis not present

## 2021-10-20 DIAGNOSIS — M069 Rheumatoid arthritis, unspecified: Secondary | ICD-10-CM | POA: Diagnosis not present

## 2021-10-20 DIAGNOSIS — Z6824 Body mass index (BMI) 24.0-24.9, adult: Secondary | ICD-10-CM | POA: Diagnosis not present

## 2022-01-09 DIAGNOSIS — K219 Gastro-esophageal reflux disease without esophagitis: Secondary | ICD-10-CM | POA: Insufficient documentation

## 2022-01-09 DIAGNOSIS — M069 Rheumatoid arthritis, unspecified: Secondary | ICD-10-CM | POA: Insufficient documentation

## 2022-01-09 DIAGNOSIS — D509 Iron deficiency anemia, unspecified: Secondary | ICD-10-CM | POA: Insufficient documentation

## 2022-01-09 DIAGNOSIS — M1991 Primary osteoarthritis, unspecified site: Secondary | ICD-10-CM | POA: Insufficient documentation

## 2022-01-09 HISTORY — DX: Primary osteoarthritis, unspecified site: M19.91

## 2022-01-09 HISTORY — DX: Iron deficiency anemia, unspecified: D50.9

## 2022-01-15 NOTE — Progress Notes (Unsigned)
Cardiology Office Note:    Date:  01/16/2022   ID:  Vickie White, DOB 1945/09/13, MRN [id number]  PCP:  Ocie Doyne., MD  Cardiologist:  Shirlee More, MD   Referring MD: Ocie Doyne., MD  ASSESSMENT:    1. Precordial pain   2. Primary hypertension   3. Rheumatoid arthritis, involving unspecified site, unspecified whether rheumatoid factor present ([place])    PLAN:    In order of problems listed above:  She has a pattern of chronic chest pain that I would describe it as possible angina.  It certainly does not sound to be GI or typical reflux in etiology.  We discussed further evaluation I think she is best served by undergoing cardiac CT for both coronary calcium score as well as the presence or absence and severity of CAD and guide treatment if she were to require revascularization.  She has no dye allergy or renal insufficiency and will be scheduled in the next few weeks and we will work around her upcoming vacation. Trend blood pressure she may require additional treatment Stable managed by rheumatology I will be sure to send a copy of my note to Dr. Amil Amen  Next appointment 3 months   Medication Adjustments/Labs and Tests Ordered: Current medicines are reviewed at length with the patient today.  Concerns regarding medicines are outlined above.  Orders Placed This Encounter  Procedures   CT CORONARY MORPH W/CTA COR W/SCORE W/CA W/CM &/OR WO/CM   Basic Metabolic Panel (BMET)   EKG 12-Lead   Meds ordered this encounter  Medications   metoprolol tartrate (LOPRESSOR) 100 MG tablet    Sig: Take 1 tablet (100 mg total) by mouth once for 1 dose. Please take 2 hours before CT    Dispense:  1 tablet    Refill:  0      Chief complaint: I been having chest pain I am concerned I have unrecognized heart disease  History of Present Illness:    Vickie White is a 76 y.o. female with a history of hypertension and rheumatoid arthritis who is being seen today for the  evaluation of chest pain at the request of Ocie Doyne., MD.  Seen in the emergency room [facility] 02/05/2020 for chest pain.  She had a hypertensive urgency was admitted to the hospital monitored serial cardiac enzymes were normal.  She had a stress myocardial perfusion study with an exercise tolerance of 7 METS normal EKG and blood pressure response rare PVCs and normal myocardial perfusion.  She also had a CTA of the chest performed which showed a subpleural 3 mm nodule right upper lobe no findings of pulmonary embolism and no coronary artery atherosclerosis.  Twelve-lead EKG showed sinus rhythm and ischemic T wave inversions 1 aVL V5 and V6.  I had in the past care for both her mother and father's patient's She has no known history of heart disease congenital rheumatic murmur or atrial fibrillation She has never had pleurisy or pericarditis or pulmonary involvement with her rheumatoid arthritis In the past she told me she had taken high-dose steroids.  She has a long history of intermittent chest discomfort she is attributed to hiatal hernia Is unrelated to meals no associated indigestion or acid blockers to her mouth The episodes are not exertional but are relieved with rest sometimes up to 30 to 45 minutes and also by taking Prilosec as needed Located in the left chest does not radiate to the jaw shoulder or back it  is not pleuritic in nature. Recently the happen more frequently about once a week and are more prolonged and more severe it is made her wonder if she has underlying heart disease It is not occurred at nighttime to awaken her from her sleep She has no exercise intolerance exertional chest pain shortness of breath palpitation or syncope. Past Medical History:  Diagnosis Date   Chest pain 02/05/2020   Gastroenteritis 02/05/2020   GERD (gastroesophageal reflux disease)    Hypertensive urgency 02/05/2020   Iron deficiency anemia 01/09/2022   Primary osteoarthritis 01/09/2022    Rheumatoid arthritis ([place])     Past Surgical History:  Procedure Laterality Date   ABDOMINAL HYSTERECTOMY  1980   LUMBAR [place]   L4 and L5    Current Medications: Current Meds  Medication Sig   metoprolol tartrate (LOPRESSOR) 100 MG tablet Take 1 tablet (100 mg total) by mouth once for 1 dose. Please take 2 hours before CT   Multiple Vitamin (MULTIVITAMIN) tablet Take 1 tablet by mouth daily.   omeprazole (PRILOSEC) 20 MG capsule Take 20 mg by mouth as needed for other. Hiatal hernia     Allergies:   Demerol [meperidine], Penicillamine, Valsartan, and Penicillins   Social History   Socioeconomic History   Marital status: Married    Spouse name: Not on file   Number of children: Not on file   Years of education: Not on file   Highest education level: Not on file  Occupational History   Not on file  Tobacco Use   Smoking status: Never   Smokeless tobacco: Never  Substance and Sexual Activity   Alcohol use: No   Drug use: No   Sexual activity: Not on file  Other Topics Concern   Not on file  Social History Narrative   Not on file   Social Determinants of Health   Financial Resource Strain: Not on file  Food Insecurity: Not on file  Transportation Needs: Not on file  Physical Activity: Not on file  Stress: Not on file  Social Connections: Not on file     Family History: The patient's family history is negative for Colon cancer.  Both parents with heart disease  ROS:   ROS Please see the history of present illness.    She now has a skin side effect of Enbrel all other systems reviewed and are negative.  EKGs/Labs/Other Studies Reviewed:    The following studies were reviewed today:   EKG:  EKG is  ordered today.  The ekg ordered today is personally reviewed and demonstrates sinus rhythm QS in V2 lead placement versus old anteroseptal MI  Recent Labs: 04/14/2020 cholesterol 189 LDL 120 0 03/23/2021 hemoglobin 10.9  creatinine 0.72  Physical Exam:    VS:  BP (!) 158/94   Pulse (!) 101   Ht 5' 3.6" (1.615 m)   Wt 141 lb 1.9 oz (64 kg)   SpO2 99%   BMI 24.53 kg/m     Wt Readings from Last 3 Encounters:  01/16/22 141 lb 1.9 oz (64 kg)  06/10/13 176 lb (79.8 kg)  05/28/13 176 lb 6.4 oz (80 kg)     GEN: She does not look chronically ill or debilitated she appears her age well nourished, well developed in no acute distress HEENT: Normal NECK: No JVD; No carotid bruits LYMPHATICS: No lymphadenopathy CARDIAC: RRR, no murmurs, rubs, gallops RESPIRATORY:  Clear to auscultation without rales, wheezing or rhonchi  ABDOMEN: Soft, non-tender, non-distended MUSCULOSKELETAL:  No edema; No deformity  SKIN: Warm and dry NEUROLOGIC:  Alert and oriented x 3 PSYCHIATRIC:  Normal affect     Signed, Shirlee More, MD  01/16/2022 12:08 PM    [facility]

## 2022-01-16 ENCOUNTER — Encounter: Payer: Self-pay | Admitting: Cardiology

## 2022-01-16 ENCOUNTER — Ambulatory Visit (INDEPENDENT_AMBULATORY_CARE_PROVIDER_SITE_OTHER): Payer: Medicare PPO | Admitting: Cardiology

## 2022-01-16 VITALS — BP 158/94 | HR 101 | Ht 63.6 in | Wt 141.1 lb

## 2022-01-16 DIAGNOSIS — I10 Essential (primary) hypertension: Secondary | ICD-10-CM

## 2022-01-16 DIAGNOSIS — M069 Rheumatoid arthritis, unspecified: Secondary | ICD-10-CM | POA: Diagnosis not present

## 2022-01-16 DIAGNOSIS — R072 Precordial pain: Secondary | ICD-10-CM

## 2022-01-16 MED ORDER — METOPROLOL TARTRATE 100 MG PO TABS: 100.0000 mg | ORAL_TABLET | Freq: Once | ORAL | 0 refills | Status: DC

## 2022-01-16 NOTE — Patient Instructions (Addendum)
Medication Instructions:  Your physician recommends that you continue on your current medications as directed. Please refer to the Current Medication list given to you today.  *If you need a refill on your cardiac medications before your next appointment, please call your pharmacy*   Lab Work:  Labs 1 week before CT: BMP  If you have labs (blood work) drawn today and your tests are completely normal, you will receive your results only by: [place] (if you have MyChart) OR A paper copy in the mail If you have any lab test that is abnormal or we need to change your treatment, we will call you to review the results.   Testing/Procedures:   Your cardiac CT will be scheduled at one of the below locations:   [facility] [address] [place], [place] [address] [phone]  [facility] [address] [place], [place] [address] [phone]  If scheduled at [facility], please arrive at the [facility] En[facility] (Entrance C2) of [facility] 30 minutes prior to test start time. You can use the FREE valet parking offered at entrance C (encouraged to control the heart rate for the test)  Proceed to the [facility] Radiology Department (first floor) to check-in and test prep.  All radiology patients and guests should use entrance C2 at [facility], accessed from [facility], even though the hospital's physical address listed is [address].    If scheduled at [facility], please arrive 15 mins early for check-in and test prep.  Please follow these instructions carefully (unless otherwise directed):  On the Night Before the Test: Be sure to Drink plenty of water. Do not consume any caffeinated/decaffeinated beverages or chocolate 12 hours prior to your test. Do not take any antihistamines 12 hours prior to your test.  On the Day  of the Test: Drink plenty of water until 1 hour prior to the test. Do not eat any food 4 hours prior to the test. You may take your regular medications prior to the test.  Take metoprolol (Lopressor) two hours prior to test.. FEMALES- please wear underwire-free bra if available, avoid dresses & tight clothing      After the Test: Drink plenty of water. After receiving IV contrast, you may experience a mild flushed feeling. This is normal. On occasion, you may experience a mild rash up to 24 hours after the test. This is not dangerous. If this occurs, you can take Benadryl 25 mg and increase your fluid intake. If you experience trouble breathing, this can be serious. If it is severe call 911 IMMEDIATELY. If it is mild, please call our office.  We will call to schedule your test 2-4 weeks out understanding that some insurance companies will need an authorization prior to the service being performed.   For non-scheduling related questions, please contact the cardiac imaging nurse navigator should you have any questions/concerns: Marchia Bond, Cardiac Imaging Nurse Navigator Gordy Clement, Cardiac Imaging Nurse Navigator [facility] Direct Office Dial: [phone]   For scheduling needs, including cancellations and rescheduling, please call [place], [phone].    Follow-Up: At [facility], you and your health needs are our priority.  As part of our continuing mission to provide you with exceptional heart care, we have created designated Provider Care Teams.  These Care Teams include your primary Cardiologist (physician) and Advanced Practice Providers (APPs -  Physician Assistants and Nurse Practitioners) who all work together to provide you with the care you need, when you need it.  We recommend signing up for the patient portal called "MyChart".  Sign up information is provided on this After Visit Summary.  MyChart is used to connect with patients for  Virtual Visits (Telemedicine).  Patients are able to view lab/test results, encounter notes, upcoming appointments, etc.  Non-urgent messages can be sent to your provider as well.   To learn more about what you can do with MyChart, go to [email or web address].    Your next appointment:   3 month(s)  The format for your next appointment:   In Person  Provider:   Shirlee More, MD    Other Instructions None  Important Information About [place]

## 2022-01-23 DIAGNOSIS — M0579 Rheumatoid arthritis with rheumatoid factor of multiple sites without organ or systems involvement: Secondary | ICD-10-CM | POA: Diagnosis not present

## 2022-01-23 DIAGNOSIS — M1991 Primary osteoarthritis, unspecified site: Secondary | ICD-10-CM | POA: Diagnosis not present

## 2022-01-23 DIAGNOSIS — D508 Other iron deficiency anemias: Secondary | ICD-10-CM | POA: Diagnosis not present

## 2022-01-23 DIAGNOSIS — M791 Myalgia, unspecified site: Secondary | ICD-10-CM | POA: Diagnosis not present

## 2022-01-23 DIAGNOSIS — M25561 Pain in right knee: Secondary | ICD-10-CM | POA: Diagnosis not present

## 2022-01-23 DIAGNOSIS — Z6824 Body mass index (BMI) 24.0-24.9, adult: Secondary | ICD-10-CM | POA: Diagnosis not present

## 2022-02-06 ENCOUNTER — Other Ambulatory Visit (HOSPITAL_COMMUNITY): Payer: Medicare PPO

## 2022-02-08 DIAGNOSIS — R072 Precordial pain: Secondary | ICD-10-CM | POA: Diagnosis not present

## 2022-02-09 ENCOUNTER — Telehealth: Payer: Self-pay

## 2022-02-09 LAB — BASIC METABOLIC PANEL
BUN: 10 mg/dL (ref 8–27)
CO2: 24 mmol/L (ref 20–29)
Calcium: 9.2 mg/dL (ref 8.7–10.3)
Chloride: 104 mmol/L (ref 96–106)
Creatinine, Ser: 0.58 mg/dL (ref 0.57–1.00)
Glucose: 82 mg/dL (ref 70–99)
Potassium: 3.8 mmol/L (ref 3.5–5.2)
Sodium: 139 mmol/L (ref 134–144)
eGFR: 93 mL/min/{1.73_m2} (ref >59)

## 2022-02-09 NOTE — Telephone Encounter (Signed)
Patient notified of results.

## 2022-02-15 ENCOUNTER — Telehealth (HOSPITAL_COMMUNITY): Payer: Self-pay | Admitting: Emergency Medicine

## 2022-02-15 NOTE — Telephone Encounter (Signed)
Reaching out to patient to offer assistance regarding upcoming cardiac imaging study; pt verbalizes understanding of appt date/time, parking situation and where to check in, pre-test NPO status and medications ordered, and verified current allergies; name and call back number provided for further questions should they arise Marchia Bond RN Navigator Cardiac Imaging [facility] [phone] office [phone] cell  '100mg'$  metoprolol tartrate Denies iv issues Arrival 1030

## 2022-02-16 ENCOUNTER — Ambulatory Visit (HOSPITAL_COMMUNITY)
Admission: RE | Admit: 2022-02-16 | Discharge: 2022-02-16 | Disposition: A | Discharge status: Home / Self Care | Payer: Medicare PPO | Source: Ambulatory Visit | Attending: Cardiology | Admitting: Cardiology

## 2022-02-16 DIAGNOSIS — R072 Precordial pain: Secondary | ICD-10-CM | POA: Insufficient documentation

## 2022-02-16 MED ORDER — IOHEXOL 350 MG/ML SOLN
100.0000 mL | Freq: Once | INTRAVENOUS | Status: AC | PRN
  Administered 2022-02-16: 100 mL via INTRAVENOUS

## 2022-02-16 MED ORDER — NITROGLYCERIN 0.4 MG SL SUBL
SUBLINGUAL_TABLET | SUBLINGUAL | Status: AC
  Filled 2022-02-16: qty 2

## 2022-02-16 MED ORDER — NITROGLYCERIN 0.4 MG SL SUBL
0.8000 mg | SUBLINGUAL_TABLET | Freq: Once | SUBLINGUAL | Status: AC
  Administered 2022-02-16: 0.8 mg via SUBLINGUAL

## 2022-03-13 ENCOUNTER — Telehealth: Payer: Self-pay | Admitting: Cardiology

## 2022-03-13 NOTE — Telephone Encounter (Signed)
Pt c/o of Chest Pain: STAT if CP now or developed within 24 hours  1. Are you having CP right now? no  2. Are you experiencing any other symptoms (ex. SOB, nausea, vomiting, sweating)? no  3. How long have you been experiencing CP? About 4 days  4. Is your CP continuous or coming and going? Comes and goes   5. Have you taken Nitroglycerin? No  Patient said the pain in her chest feels similar to a pulled muscle across her chest. It feels sore when she puts pressure on her chest.  She first noticed the pain when she leaned across her chest [place]. She said she also notices the pain when she lays on her chest.  She is not sure if she pulled a muscle or if this is heart related. She wanted to know if Dr. Bettina Gavia saw anything abnormal on her recent CT  ?
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
## 2022-04-20 ENCOUNTER — Ambulatory Visit: Payer: Medicare PPO | Attending: Cardiology | Admitting: Cardiology

## 2022-04-20 ENCOUNTER — Encounter: Payer: Self-pay | Admitting: Cardiology

## 2022-04-20 VITALS — BP 148/80 | HR 76 | Ht 64.0 in | Wt 136.4 lb

## 2022-04-20 DIAGNOSIS — R072 Precordial pain: Secondary | ICD-10-CM

## 2022-04-20 DIAGNOSIS — I10 Essential (primary) hypertension: Secondary | ICD-10-CM | POA: Diagnosis not present

## 2022-04-20 NOTE — Patient Instructions (Signed)
Medication Instructions:  Your physician recommends that you continue on your current medications as directed. Please refer to the Current Medication list given to you today.  *If you need a refill on your cardiac medications before your next appointment, please call your pharmacy*   Lab Work: None If you have labs (blood work) drawn today and your tests are completely normal, you will receive your results only by: MyChart Message (if you have MyChart) OR A paper copy in the mail If you have any lab test that is abnormal or we need to change your treatment, we will call you to review the results.   Testing/Procedures: None   Follow-Up: At [facility], you and your health needs are our priority.  As part of our continuing mission to provide you with exceptional heart care, we have created designated Provider Care Teams.  These Care Teams include your primary Cardiologist (physician) and Advanced Practice Providers (APPs -  Physician Assistants and Nurse Practitioners) who all work together to provide you with the care you need, when you need it.  We recommend signing up for the patient portal called "MyChart".  Sign up information is provided on this After Visit Summary.  MyChart is used to connect with patients for Virtual Visits (Telemedicine).  Patients are able to view lab/test results, encounter notes, upcoming appointments, etc.  Non-urgent messages can be sent to your provider as well.   To learn more about what you can do with MyChart, go to [email or web address].    Your next appointment:   Follow up as needed  The format for your next appointment:   In Person  Provider:   Brian Munley, MD    Other Instructions None  Important Information About [place]       

## 2022-04-20 NOTE — Progress Notes (Signed)
Cardiology Office Note:    Date:  04/20/2022   ID:  Vickie White, DOB Nov 11, 1945, MRN [id number]  PCP:  Ocie Doyne., MD  Cardiologist:  Shirlee More, MD    Referring MD: Ocie Doyne., MD    ASSESSMENT:    1. Precordial pain   2. Primary hypertension    PLAN:    In order of problems listed above:  In retrospect her symptoms are musculoskeletal costochondritis she is resolved.  Calcium score of 0 coronary angiography near normal with very mild atherosclerosis noncalcified and the remainder the images reassuring Continue her current antihypertensive   Next appointment: I will plan to see her back again in the future as needed   Medication Adjustments/Labs and Tests Ordered: Current medicines are reviewed at length with the patient today.  Concerns regarding medicines are outlined above.  No orders of the defined types were placed in this encounter.  No orders of the defined types were placed in this encounter.   Chief Complaint  Patient presents with   Follow-up    After cardiac CTA    History of Present Illness:    Vickie White is a 76 y.o. female with a hx of hypertension rheumatoid arthritis last seen 01/16/2022 for chest pain.  She was seen in the emergency room [facility] 02/05/2020 for chest pain.  She had a hypertensive urgency was admitted to the hospital monitored serial cardiac enzymes were normal.   She had a stress myocardial perfusion study with an exercise tolerance of 7 METS normal EKG and blood pressure response rare PVCs and normal myocardial perfusion.  She also had a CTA of the chest performed which showed a subpleural 3 mm nodule right upper lobe no findings of pulmonary embolism and no coronary artery atherosclerosis.  Twelve-lead EKG showed sinus rhythm and ischemic T wave inversions 1 aVL V5 and V6.    Compliance with diet, lifestyle and medications: Yes  In retrospect her initial presentation was typical costochondral pain.  Her  husband had fallen she struggled to lift him and afterwards had point localized sharp pain in the CC J on the left.  Symptoms have resolved We reviewed the results of her cardiac CTA and I would describe it is near normal with minimal coronary atherosclerosis and a calcium score of 0 her lungs are normal despite rheumatoid arthritis she has no valvular calcification and the appearance of her thoracic aorta is normal she is reassured.  She had a cardiac CTA reported 02/16/2022 with a coronary calcium score of 0 noncalcified atherosclerosis proximal right coronary with less than 25% stenosis and no other abnormality on over read of chest CT Past Medical History:  Diagnosis Date   Chest pain 02/05/2020   Gastroenteritis 02/05/2020   GERD (gastroesophageal reflux disease)    Hypertensive urgency 02/05/2020   Iron deficiency anemia 01/09/2022   Primary osteoarthritis 01/09/2022   Rheumatoid arthritis ([place])     Past Surgical History:  Procedure Laterality Date   ABDOMINAL HYSTERECTOMY  1980   LUMBAR [place]   L4 and L5    Current Medications: Current Meds  Medication Sig   folic acid (FOLVITE) 1 MG tablet Take 2 tablets by mouth daily.   methotrexate (RHEUMATREX) 2.5 MG tablet Take 5 tablets by mouth once a week.   Multiple Vitamin (MULTIVITAMIN) tablet Take 1 tablet by mouth daily.   omeprazole (PRILOSEC) 20 MG capsule Take 20 mg by mouth as needed  for other. Hiatal hernia     Allergies:   Demerol [meperidine], Penicillamine, Valsartan, and Penicillins   Social History   Socioeconomic History   Marital status: Married    Spouse name: Not on file   Number of children: Not on file   Years of education: Not on file   Highest education level: Not on file  Occupational History   Not on file  Tobacco Use   Smoking status: Never   Smokeless tobacco: Never  Substance and Sexual Activity   Alcohol use: No   Drug use: No   Sexual activity: Not on file   Other Topics Concern   Not on file  Social History Narrative   Not on file   Social Determinants of Health   Financial Resource Strain: Not on file  Food Insecurity: Not on file  Transportation Needs: Not on file  Physical Activity: Not on file  Stress: Not on file  Social Connections: Not on file     Family History: The patient's family history is negative for Colon cancer. ROS:   Please see the history of present illness.    All other systems reviewed and are negative.  EKGs/Labs/Other Studies Reviewed:    The following studies were reviewed today:   Recent Labs: 02/08/2022: BUN 10; Creatinine, Ser 0.58; Potassium 3.8; Sodium 139  Recent Lipid Panel 04/14/2020 cholesterol 189 LDL 120  Physical Exam:    VS:  BP (!) 148/80 (BP Location: Left Arm, Patient Position: Sitting, Cuff Size: Normal)   Pulse 76   Ht '5\' 4"'$  (1.626 m)   Wt 136 lb 6.4 oz (61.9 kg)   SpO2 99%   BMI 23.41 kg/m     Wt Readings from Last 3 Encounters:  04/20/22 136 lb 6.4 oz (61.9 kg)  01/16/22 141 lb 1.9 oz (64 kg)  06/10/13 176 lb (79.8 kg)     GEN:  Well nourished, well developed in no acute distress HEENT: Normal NECK: No JVD; No carotid bruits LYMPHATICS: No lymphadenopathy CARDIAC: RRR, no murmurs, rubs, gallops she has no chest wall tenderness RESPIRATORY:  Clear to auscultation without rales, wheezing or rhonchi  ABDOMEN: Soft, non-tender, non-distended MUSCULOSKELETAL:  No edema; No deformity  SKIN: Warm and dry NEUROLOGIC:  Alert and oriented x 3 PSYCHIATRIC:  Normal affect    Signed, Shirlee More, MD  04/20/2022 10:22 AM    [facility]

## 2022-04-30 ENCOUNTER — Ambulatory Visit: Payer: Medicare PPO | Admitting: Cardiology

## 2022-05-03 DIAGNOSIS — D508 Other iron deficiency anemias: Secondary | ICD-10-CM | POA: Diagnosis not present

## 2022-05-03 DIAGNOSIS — Z6823 Body mass index (BMI) 23.0-23.9, adult: Secondary | ICD-10-CM | POA: Diagnosis not present

## 2022-05-03 DIAGNOSIS — M791 Myalgia, unspecified site: Secondary | ICD-10-CM | POA: Diagnosis not present

## 2022-05-03 DIAGNOSIS — M1991 Primary osteoarthritis, unspecified site: Secondary | ICD-10-CM | POA: Diagnosis not present

## 2022-05-03 DIAGNOSIS — M0579 Rheumatoid arthritis with rheumatoid factor of multiple sites without organ or systems involvement: Secondary | ICD-10-CM | POA: Diagnosis not present

## 2022-05-03 DIAGNOSIS — M25561 Pain in right knee: Secondary | ICD-10-CM | POA: Diagnosis not present

## 2022-05-07 DIAGNOSIS — Z9181 History of falling: Secondary | ICD-10-CM | POA: Diagnosis not present

## 2022-05-07 DIAGNOSIS — Z139 Encounter for screening, unspecified: Secondary | ICD-10-CM | POA: Diagnosis not present

## 2022-05-07 DIAGNOSIS — B351 Tinea unguium: Secondary | ICD-10-CM | POA: Diagnosis not present

## 2022-05-07 DIAGNOSIS — Z1331 Encounter for screening for depression: Secondary | ICD-10-CM | POA: Diagnosis not present

## 2022-05-07 DIAGNOSIS — Z6822 Body mass index (BMI) 22.0-22.9, adult: Secondary | ICD-10-CM | POA: Diagnosis not present

## 2022-06-11 DIAGNOSIS — Z0000 Encounter for general adult medical examination without abnormal findings: Secondary | ICD-10-CM | POA: Diagnosis not present

## 2022-06-11 DIAGNOSIS — Z6822 Body mass index (BMI) 22.0-22.9, adult: Secondary | ICD-10-CM | POA: Diagnosis not present

## 2022-06-14 DIAGNOSIS — M19012 Primary osteoarthritis, left shoulder: Secondary | ICD-10-CM | POA: Diagnosis not present

## 2022-07-02 DIAGNOSIS — M19122 Post-traumatic osteoarthritis, left elbow: Secondary | ICD-10-CM | POA: Diagnosis not present

## 2022-08-30 DIAGNOSIS — M25512 Pain in left shoulder: Secondary | ICD-10-CM | POA: Diagnosis not present

## 2022-08-30 DIAGNOSIS — M0579 Rheumatoid arthritis with rheumatoid factor of multiple sites without organ or systems involvement: Secondary | ICD-10-CM | POA: Diagnosis not present

## 2022-08-30 DIAGNOSIS — D508 Other iron deficiency anemias: Secondary | ICD-10-CM | POA: Diagnosis not present

## 2022-08-30 DIAGNOSIS — M1991 Primary osteoarthritis, unspecified site: Secondary | ICD-10-CM | POA: Diagnosis not present

## 2022-08-30 DIAGNOSIS — M791 Myalgia, unspecified site: Secondary | ICD-10-CM | POA: Diagnosis not present

## 2022-08-30 DIAGNOSIS — Z6823 Body mass index (BMI) 23.0-23.9, adult: Secondary | ICD-10-CM | POA: Diagnosis not present

## 2022-08-30 DIAGNOSIS — M25561 Pain in right knee: Secondary | ICD-10-CM | POA: Diagnosis not present

## 2022-12-03 DIAGNOSIS — M0579 Rheumatoid arthritis with rheumatoid factor of multiple sites without organ or systems involvement: Secondary | ICD-10-CM | POA: Diagnosis not present

## 2022-12-03 DIAGNOSIS — M791 Myalgia, unspecified site: Secondary | ICD-10-CM | POA: Diagnosis not present

## 2022-12-03 DIAGNOSIS — D508 Other iron deficiency anemias: Secondary | ICD-10-CM | POA: Diagnosis not present

## 2022-12-03 DIAGNOSIS — M25561 Pain in right knee: Secondary | ICD-10-CM | POA: Diagnosis not present

## 2022-12-03 DIAGNOSIS — Z6823 Body mass index (BMI) 23.0-23.9, adult: Secondary | ICD-10-CM | POA: Diagnosis not present

## 2022-12-03 DIAGNOSIS — M25512 Pain in left shoulder: Secondary | ICD-10-CM | POA: Diagnosis not present

## 2022-12-03 DIAGNOSIS — M1991 Primary osteoarthritis, unspecified site: Secondary | ICD-10-CM | POA: Diagnosis not present

## 2022-12-24 DIAGNOSIS — H6993 Unspecified Eustachian tube disorder, bilateral: Secondary | ICD-10-CM | POA: Diagnosis not present

## 2022-12-24 DIAGNOSIS — Z6822 Body mass index (BMI) 22.0-22.9, adult: Secondary | ICD-10-CM | POA: Diagnosis not present

## 2022-12-24 DIAGNOSIS — M25512 Pain in left shoulder: Secondary | ICD-10-CM | POA: Diagnosis not present

## 2023-02-11 DIAGNOSIS — Z6822 Body mass index (BMI) 22.0-22.9, adult: Secondary | ICD-10-CM | POA: Diagnosis not present

## 2023-02-11 DIAGNOSIS — R0602 Shortness of breath: Secondary | ICD-10-CM | POA: Diagnosis not present

## 2023-02-11 DIAGNOSIS — R079 Chest pain, unspecified: Secondary | ICD-10-CM | POA: Diagnosis not present

## 2023-03-04 DIAGNOSIS — M0579 Rheumatoid arthritis with rheumatoid factor of multiple sites without organ or systems involvement: Secondary | ICD-10-CM | POA: Diagnosis not present

## 2023-05-02 DIAGNOSIS — M0579 Rheumatoid arthritis with rheumatoid factor of multiple sites without organ or systems involvement: Secondary | ICD-10-CM | POA: Diagnosis not present

## 2023-05-02 DIAGNOSIS — Z6822 Body mass index (BMI) 22.0-22.9, adult: Secondary | ICD-10-CM | POA: Diagnosis not present

## 2023-06-12 IMAGING — MG MM DIGITAL SCREENING BILAT W/ TOMO AND CAD
8 series · 9 of 24 positions shown · non-contrast
Comparison: Previous exam(s).

CLINICAL DATA: Screening.

EXAM:
DIGITAL SCREENING BILATERAL MAMMOGRAM WITH TOMOSYNTHESIS AND CAD
TECHNIQUE: Bilateral screening digital craniocaudal and mediolateral oblique
mammograms were obtained. Bilateral screening digital breast
tomosynthesis was performed. The images were evaluated with
computer-aided detection.

[L MLO synth-2D]
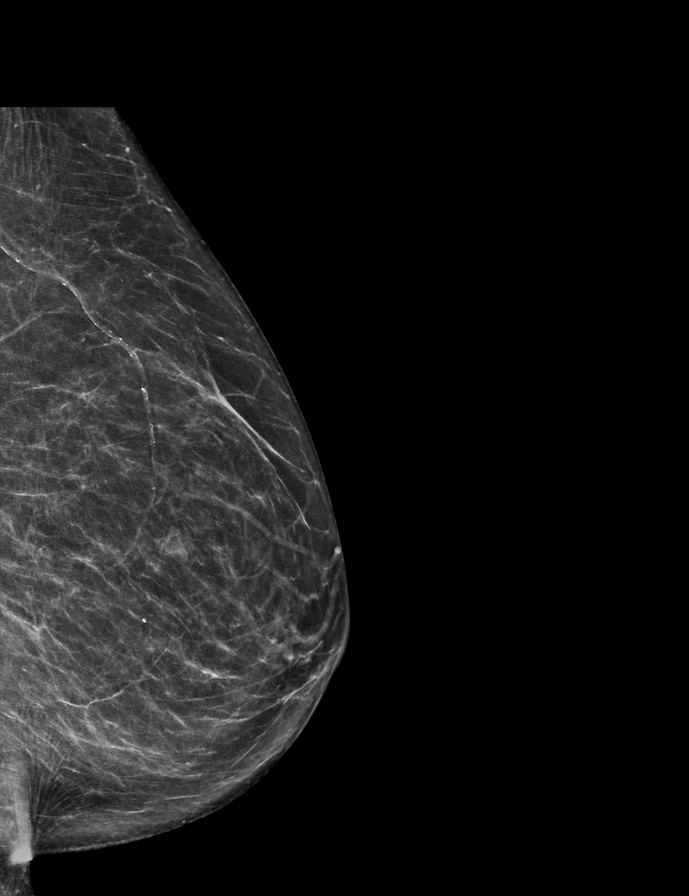

[L CC synth-2D]
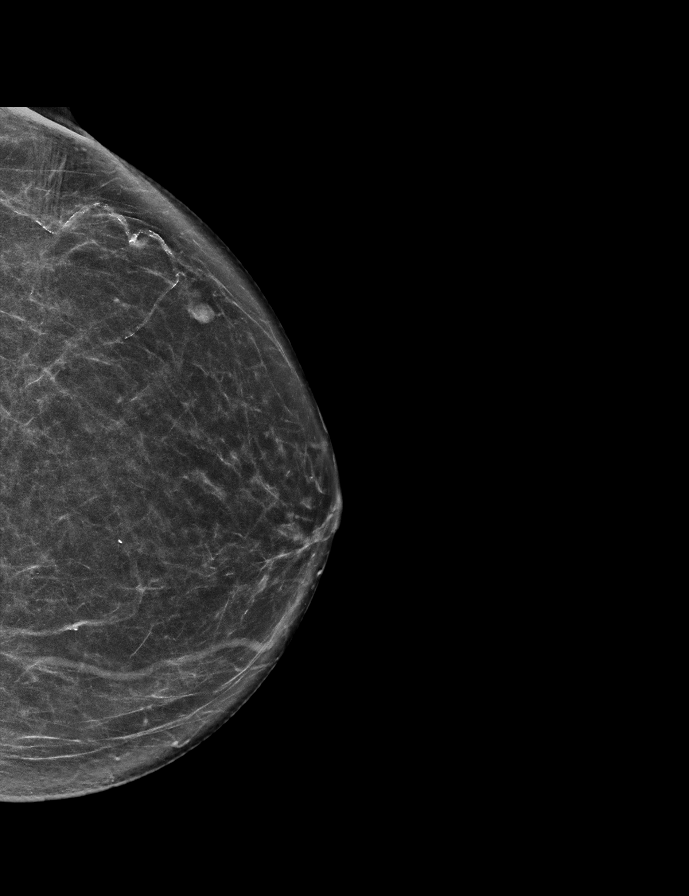

[R MLO synth-2D]
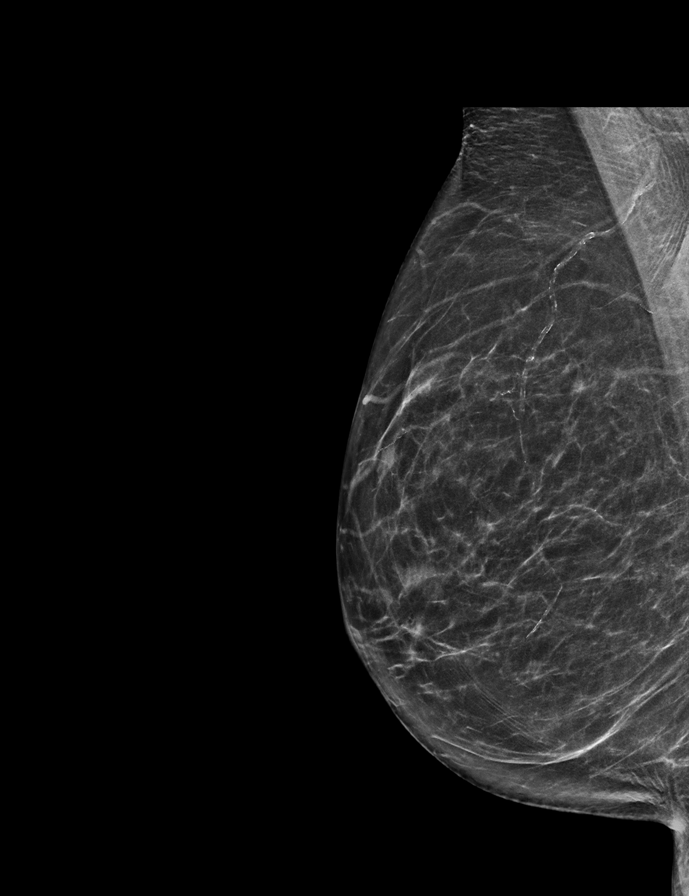

[R CC synth-2D]
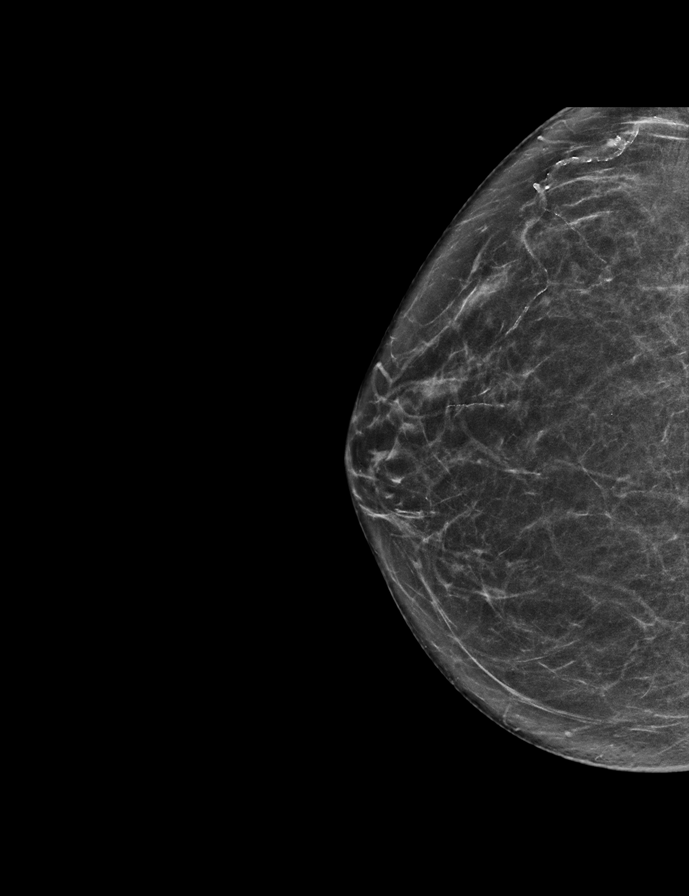

[R CC tomo · 2 of 59 frames shown]
[frame 20/59]
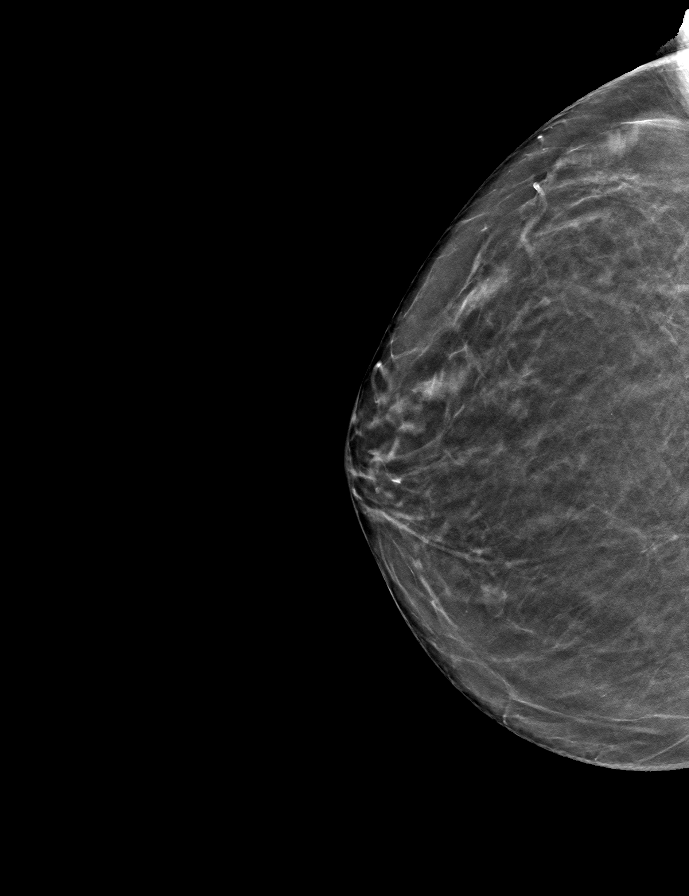
[frame 30/59]
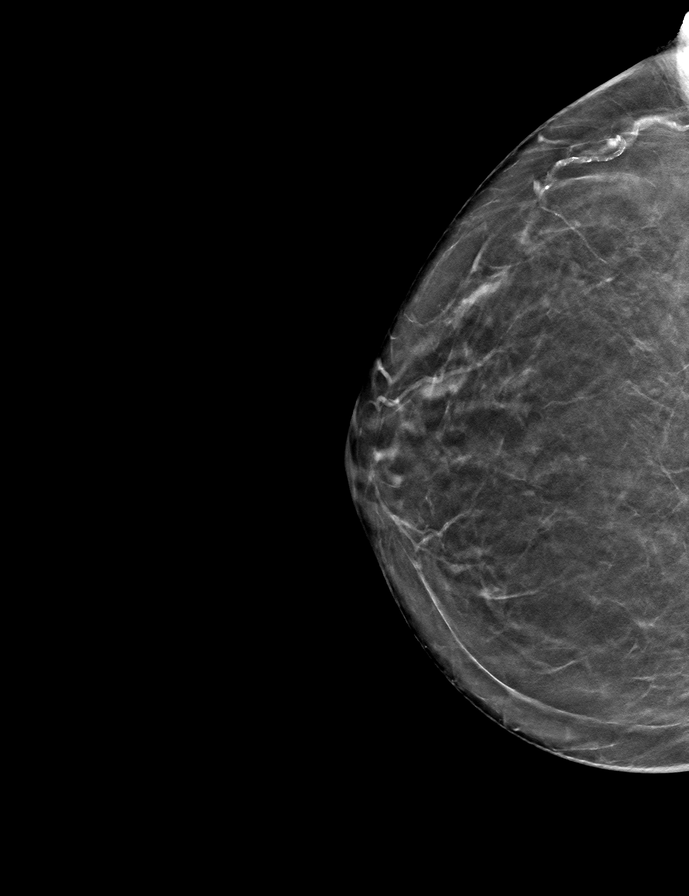

[R MLO tomo · tomo slice 31/61.0]
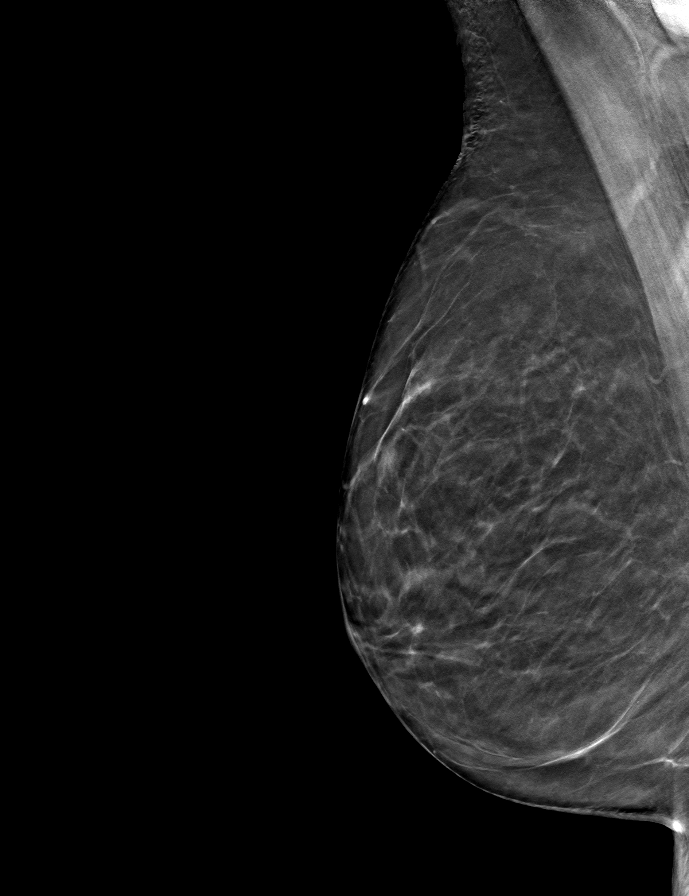

[L CC tomo · tomo slice 32/63.0]
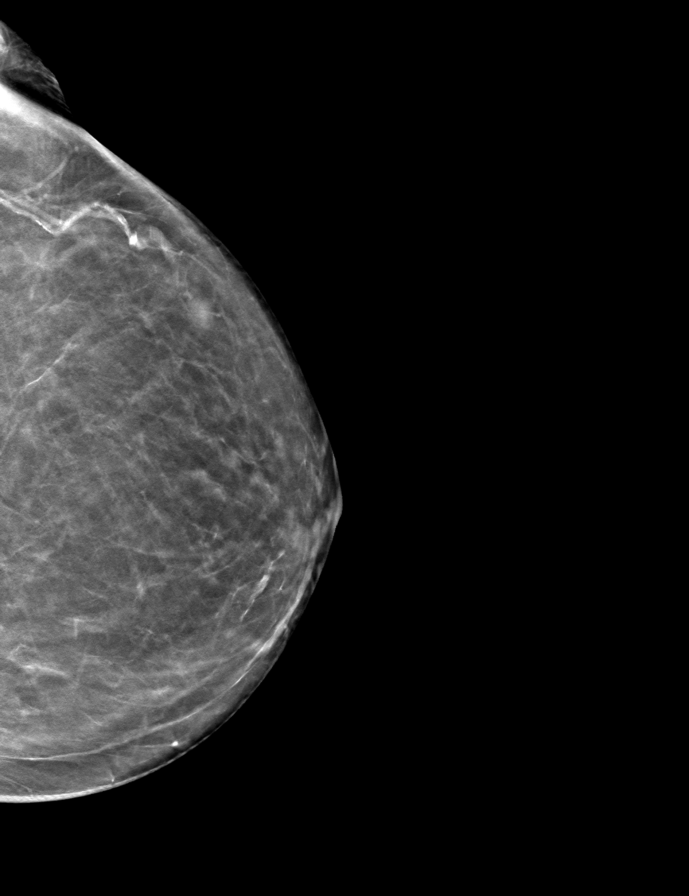

[L MLO tomo · tomo slice 31/60.0]
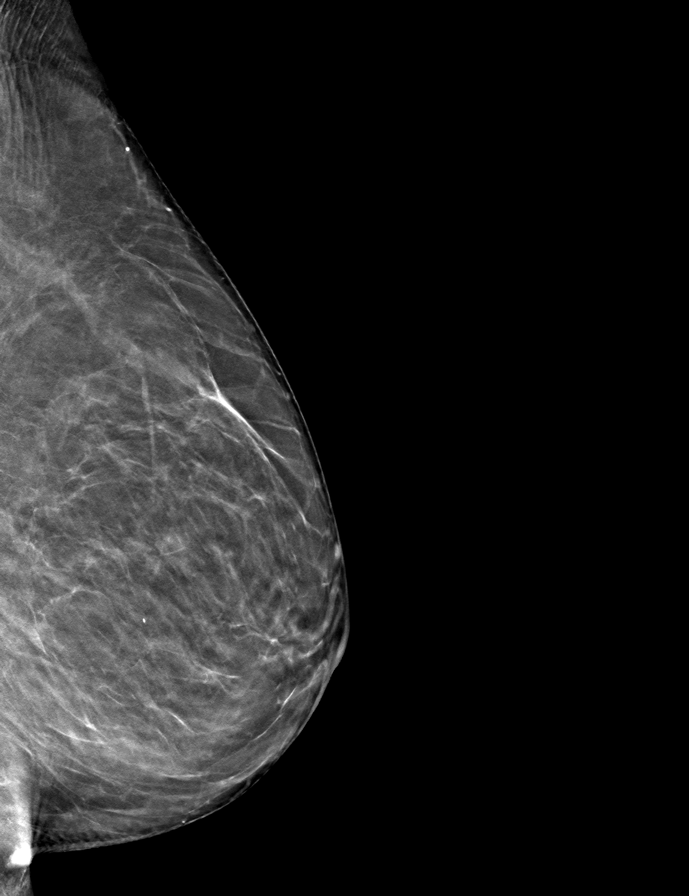

[9 of 24 positions shown; findings below may reference images not displayed]

ACR Breast Density Category b: There are scattered areas of
fibroglandular density.
FINDINGS: There are no findings suspicious for malignancy.
IMPRESSION: No mammographic evidence of malignancy. A result letter of this
screening mammogram will be mailed directly to the patient.

RECOMMENDATION:
Screening mammogram in one year. (Code:51-O-LD2)

BI-RADS CATEGORY  1: Negative.

## 2023-08-06 DIAGNOSIS — Z111 Encounter for screening for respiratory tuberculosis: Secondary | ICD-10-CM | POA: Diagnosis not present

## 2023-08-06 DIAGNOSIS — M25561 Pain in right knee: Secondary | ICD-10-CM | POA: Diagnosis not present

## 2023-08-06 DIAGNOSIS — M791 Myalgia, unspecified site: Secondary | ICD-10-CM | POA: Diagnosis not present

## 2023-08-06 DIAGNOSIS — D508 Other iron deficiency anemias: Secondary | ICD-10-CM | POA: Diagnosis not present

## 2023-08-06 DIAGNOSIS — Z6823 Body mass index (BMI) 23.0-23.9, adult: Secondary | ICD-10-CM | POA: Diagnosis not present

## 2023-08-06 DIAGNOSIS — R5383 Other fatigue: Secondary | ICD-10-CM | POA: Diagnosis not present

## 2023-08-06 DIAGNOSIS — M25512 Pain in left shoulder: Secondary | ICD-10-CM | POA: Diagnosis not present

## 2023-08-06 DIAGNOSIS — M1991 Primary osteoarthritis, unspecified site: Secondary | ICD-10-CM | POA: Diagnosis not present

## 2023-08-06 DIAGNOSIS — M0579 Rheumatoid arthritis with rheumatoid factor of multiple sites without organ or systems involvement: Secondary | ICD-10-CM | POA: Diagnosis not present

## 2023-09-04 DIAGNOSIS — Z9181 History of falling: Secondary | ICD-10-CM | POA: Diagnosis not present

## 2023-09-04 DIAGNOSIS — Z0000 Encounter for general adult medical examination without abnormal findings: Secondary | ICD-10-CM | POA: Diagnosis not present

## 2023-09-10 DIAGNOSIS — H25813 Combined forms of age-related cataract, bilateral: Secondary | ICD-10-CM | POA: Diagnosis not present

## 2023-09-27 DIAGNOSIS — D649 Anemia, unspecified: Secondary | ICD-10-CM | POA: Diagnosis not present

## 2023-09-27 DIAGNOSIS — E559 Vitamin D deficiency, unspecified: Secondary | ICD-10-CM | POA: Diagnosis not present

## 2023-09-27 DIAGNOSIS — E782 Mixed hyperlipidemia: Secondary | ICD-10-CM | POA: Diagnosis not present

## 2023-09-27 DIAGNOSIS — E538 Deficiency of other specified B group vitamins: Secondary | ICD-10-CM | POA: Diagnosis not present

## 2023-09-27 DIAGNOSIS — M0579 Rheumatoid arthritis with rheumatoid factor of multiple sites without organ or systems involvement: Secondary | ICD-10-CM | POA: Diagnosis not present

## 2023-09-27 DIAGNOSIS — Z6822 Body mass index (BMI) 22.0-22.9, adult: Secondary | ICD-10-CM | POA: Diagnosis not present

## 2023-09-27 DIAGNOSIS — N959 Unspecified menopausal and perimenopausal disorder: Secondary | ICD-10-CM | POA: Diagnosis not present

## 2023-10-01 DIAGNOSIS — E538 Deficiency of other specified B group vitamins: Secondary | ICD-10-CM | POA: Diagnosis not present

## 2023-10-15 DIAGNOSIS — E538 Deficiency of other specified B group vitamins: Secondary | ICD-10-CM | POA: Diagnosis not present

## 2023-10-29 DIAGNOSIS — E538 Deficiency of other specified B group vitamins: Secondary | ICD-10-CM | POA: Diagnosis not present

## 2023-11-12 DIAGNOSIS — E538 Deficiency of other specified B group vitamins: Secondary | ICD-10-CM | POA: Diagnosis not present

## 2023-12-24 DIAGNOSIS — M67441 Ganglion, right hand: Secondary | ICD-10-CM | POA: Diagnosis not present

## 2023-12-24 DIAGNOSIS — S91332A Puncture wound without foreign body, left foot, initial encounter: Secondary | ICD-10-CM | POA: Diagnosis not present

## 2023-12-24 DIAGNOSIS — Z6823 Body mass index (BMI) 23.0-23.9, adult: Secondary | ICD-10-CM | POA: Diagnosis not present

## 2024-01-21 DIAGNOSIS — E538 Deficiency of other specified B group vitamins: Secondary | ICD-10-CM | POA: Diagnosis not present

## 2024-02-13 DIAGNOSIS — R233 Spontaneous ecchymoses: Secondary | ICD-10-CM | POA: Diagnosis not present

## 2024-02-13 DIAGNOSIS — Z6823 Body mass index (BMI) 23.0-23.9, adult: Secondary | ICD-10-CM | POA: Diagnosis not present

## 2024-02-24 DIAGNOSIS — D508 Other iron deficiency anemias: Secondary | ICD-10-CM | POA: Diagnosis not present

## 2024-03-30 DIAGNOSIS — D508 Other iron deficiency anemias: Secondary | ICD-10-CM | POA: Diagnosis not present

## 2024-04-01 DIAGNOSIS — Z6823 Body mass index (BMI) 23.0-23.9, adult: Secondary | ICD-10-CM | POA: Diagnosis not present

## 2024-04-01 DIAGNOSIS — F4329 Adjustment disorder with other symptoms: Secondary | ICD-10-CM | POA: Diagnosis not present

## 2024-04-01 DIAGNOSIS — R630 Anorexia: Secondary | ICD-10-CM | POA: Diagnosis not present

## 2024-05-19 DIAGNOSIS — Z6824 Body mass index (BMI) 24.0-24.9, adult: Secondary | ICD-10-CM | POA: Diagnosis not present

## 2024-05-19 DIAGNOSIS — M0579 Rheumatoid arthritis with rheumatoid factor of multiple sites without organ or systems involvement: Secondary | ICD-10-CM | POA: Diagnosis not present

## 2024-05-19 DIAGNOSIS — M25512 Pain in left shoulder: Secondary | ICD-10-CM | POA: Diagnosis not present

## 2024-05-19 DIAGNOSIS — M791 Myalgia, unspecified site: Secondary | ICD-10-CM | POA: Diagnosis not present

## 2024-05-19 DIAGNOSIS — D508 Other iron deficiency anemias: Secondary | ICD-10-CM | POA: Diagnosis not present

## 2024-05-19 DIAGNOSIS — R5383 Other fatigue: Secondary | ICD-10-CM | POA: Diagnosis not present

## 2024-05-19 DIAGNOSIS — M1991 Primary osteoarthritis, unspecified site: Secondary | ICD-10-CM | POA: Diagnosis not present

## 2024-05-19 DIAGNOSIS — M25561 Pain in right knee: Secondary | ICD-10-CM | POA: Diagnosis not present

## 2024-07-13 DIAGNOSIS — Z5320 Procedure and treatment not carried out because of patient's decision for unspecified reasons: Secondary | ICD-10-CM | POA: Diagnosis not present

## 2024-07-13 DIAGNOSIS — E538 Deficiency of other specified B group vitamins: Secondary | ICD-10-CM | POA: Diagnosis not present

## 2024-07-13 DIAGNOSIS — Z6824 Body mass index (BMI) 24.0-24.9, adult: Secondary | ICD-10-CM | POA: Diagnosis not present

## 2024-07-13 DIAGNOSIS — K219 Gastro-esophageal reflux disease without esophagitis: Secondary | ICD-10-CM | POA: Diagnosis not present

## 2024-07-13 DIAGNOSIS — M0579 Rheumatoid arthritis with rheumatoid factor of multiple sites without organ or systems involvement: Secondary | ICD-10-CM | POA: Diagnosis not present

## 2024-07-13 DIAGNOSIS — Z2821 Immunization not carried out because of patient refusal: Secondary | ICD-10-CM | POA: Diagnosis not present

## 2024-07-13 DIAGNOSIS — E782 Mixed hyperlipidemia: Secondary | ICD-10-CM | POA: Diagnosis not present

## 2024-07-13 DIAGNOSIS — E559 Vitamin D deficiency, unspecified: Secondary | ICD-10-CM | POA: Diagnosis not present

## 2024-07-13 DIAGNOSIS — F324 Major depressive disorder, single episode, in partial remission: Secondary | ICD-10-CM | POA: Diagnosis not present

## 2024-07-21 DIAGNOSIS — E538 Deficiency of other specified B group vitamins: Secondary | ICD-10-CM | POA: Diagnosis not present

## 2024-07-28 DIAGNOSIS — M549 Dorsalgia, unspecified: Secondary | ICD-10-CM | POA: Diagnosis not present

## 2024-07-28 DIAGNOSIS — Z6824 Body mass index (BMI) 24.0-24.9, adult: Secondary | ICD-10-CM | POA: Diagnosis not present

## 2024-08-19 ENCOUNTER — Ambulatory Visit: Admitting: Cardiology

## 2024-08-19 ENCOUNTER — Encounter: Payer: Self-pay | Admitting: Cardiology

## 2024-08-19 VITALS — BP 150/100 | HR 118 | Ht 63.6 in | Wt 137.8 lb

## 2024-08-19 DIAGNOSIS — I259 Chronic ischemic heart disease, unspecified: Secondary | ICD-10-CM | POA: Diagnosis not present

## 2024-08-19 DIAGNOSIS — R079 Chest pain, unspecified: Secondary | ICD-10-CM | POA: Diagnosis not present

## 2024-08-19 DIAGNOSIS — E782 Mixed hyperlipidemia: Secondary | ICD-10-CM | POA: Insufficient documentation

## 2024-08-19 DIAGNOSIS — I209 Angina pectoris, unspecified: Secondary | ICD-10-CM | POA: Insufficient documentation

## 2024-08-19 DIAGNOSIS — R030 Elevated blood-pressure reading, without diagnosis of hypertension: Secondary | ICD-10-CM

## 2024-08-19 DIAGNOSIS — I2510 Atherosclerotic heart disease of native coronary artery without angina pectoris: Secondary | ICD-10-CM

## 2024-08-19 DIAGNOSIS — I25119 Atherosclerotic heart disease of native coronary artery with unspecified angina pectoris: Secondary | ICD-10-CM | POA: Diagnosis not present

## 2024-08-19 HISTORY — DX: Atherosclerotic heart disease of native coronary artery without angina pectoris: I25.10

## 2024-08-19 HISTORY — DX: Elevated blood-pressure reading, without diagnosis of hypertension: R03.0

## 2024-08-19 HISTORY — DX: Angina pectoris, unspecified: I20.9

## 2024-08-19 HISTORY — DX: Mixed hyperlipidemia: E78.2

## 2024-08-19 MED ORDER — NITROGLYCERIN 0.4 MG SL SUBL: 0.4000 mg | SUBLINGUAL_TABLET | SUBLINGUAL | 6 refills | Status: AC | PRN

## 2024-08-19 MED ORDER — ASPIRIN 81 MG PO TBEC: 81.0000 mg | DELAYED_RELEASE_TABLET | Freq: Every day | ORAL | Status: AC

## 2024-08-19 MED ORDER — METOPROLOL TARTRATE 100 MG PO TABS: 100.0000 mg | ORAL_TABLET | Freq: Once | ORAL | 0 refills | Status: DC

## 2024-08-19 NOTE — Patient Instructions (Signed)
 Medication Instructions:  Your physician has recommended you make the following change in your medication:   Take 81 mg coated aspirin daily.  Use nitroglycerin  1 tablet placed under the tongue at the first sign of chest pain or an angina attack. 1 tablet may be used every 5 minutes as needed, for up to 15 minutes. Do not take more than 3 tablets in 15 minutes. If pain persist call 911 or go to the nearest ED.   *If you need a refill on your cardiac medications before your next appointment, please call your pharmacy*   Lab Work: Your physician recommends that you return for lab work in: the next few days for CMP, CBC, TSH, vitamin D, vitamin B12 and fasting lipids. You need to have labs done when you are fasting.  You can come Monday through Friday 8:30 am to 12:00 pm and 1:15 to 4:30. You do not need to make an appointment as the order has already been placed.   If you have labs (blood work) drawn today and your tests are completely normal, you will receive your results only by: MyChart Message (if you have MyChart) OR A paper copy in the mail If you have any lab test that is abnormal or we need to change your treatment, we will call you to review the results.   Testing/Procedures:   Your cardiac CT will be scheduled at one of the below locations:   [facility] [address] [place], [place] [phone]  Please follow these instructions carefully (unless otherwise directed):  An IV will be required for this test and Nitroglycerin  will be given.   On the Night Before the Test: Be sure to Drink plenty of water. Do not consume any caffeinated/decaffeinated beverages or chocolate 12 hours prior to your test. Do not take any antihistamines 12 hours prior to your test.   On the Day of the Test: Drink plenty of water until 1 hour prior to the test. Do not eat any food 1 hour prior to test. You may take your regular medications prior to the test.  Take metoprolol   (Lopressor ) 100 mg two hours prior to test. FEMALES- please wear underwire-free bra if available, avoid dresses & tight clothing      After the Test: Drink plenty of water. After receiving IV contrast, you may experience a mild flushed feeling. This is normal. On occasion, you may experience a mild rash up to 24 hours after the test. This is not dangerous. If this occurs, you can take Benadryl 25 mg, Zyrtec, Claritin, or Allegra and increase your fluid intake. (Patients taking Tikosyn should avoid Benadryl, and may take Zyrtec, Claritin, or Allegra) If you experience trouble breathing, this can be serious. If it is severe call 911 IMMEDIATELY. If it is mild, please call our office.  We will call to schedule your test 2-4 weeks out understanding that some insurance companies will need an authorization prior to the service being performed.   For more information and frequently asked questions, please visit our website : [email or web address]  For non-scheduling related questions, please contact the cardiac imaging nurse navigator should you have any questions/concerns: Cardiac Imaging Nurse Navigators Direct Office Dial: [phone]   For scheduling needs, including cancellations and rescheduling, please call Brittany, [phone].    Follow-Up: At [facility], you and your health needs are our priority.  As part of our continuing mission to provide you with exceptional heart care, we have created designated Provider Care Teams.  These  Care Teams include your primary Cardiologist (physician) and Advanced Practice Providers (APPs -  Physician Assistants and Nurse Practitioners) who all work together to provide you with the care you need, when you need it.  We recommend signing up for the patient portal called MyChart.  Sign up information is provided on this After Visit Summary.  MyChart is used to connect with patients for Virtual Visits (Telemedicine).  Patients are  able to view lab/test results, encounter notes, upcoming appointments, etc.  Non-urgent messages can be sent to your provider as well.   To learn more about what you can do with MyChart, go to [email or web address].    Your next appointment:   6 month(s)  The format for your next appointment:   In Person  Provider:   Jennifer Crape, MD    Other Instructions none  Important Information About Sugar

## 2024-08-19 NOTE — Progress Notes (Signed)
 " Cardiology Office Note:    Date:  08/19/2024   ID:  Vickie White, DOB 21-Jun-1946, MRN [id number]  PCP:  Georgia Norleen FALCON., MD  Cardiologist:  Jennifer JONELLE Crape, MD   Referring MD: Georgia Norleen FALCON., MD    ASSESSMENT:    1. Chest pain, unspecified type   2. Coronary artery calcification   3. Elevated blood pressure reading in office without diagnosis of hypertension   4. Angina pectoris   5. Mixed dyslipidemia    PLAN:    In order of problems listed above:  Angina pectoris: I discussed my findings with the patient at length and following recommendations were made.  She was told to take a coated baby aspirin on a daily basis.  Sublingual nitroglycerin  prescription was sent, its protocol and 911 protocol explained and the patient vocalized understanding questions were answered to the patient's satisfaction.  I discussed various modalities of treatment and she prefers CT coronary angiography and we will set this up for her. Elevated blood pressure without diagnosis of hypertension: She does.  Her blood pressure is stable at home she is a little anxious about today's visit.  I reassured her.  Her blood pressure at home ranges in the 120/60 area. Mixed dyslipidemia: Not on statin therapy for unclear reason especially in view of coronary artery calcification.  Will do complete fasting blood work including vitamin D in view of history of deficiency.  We will initiate her on statin once we have these results available. Patient will be seen in follow-up appointment in 6 months or earlier if the patient has any concerns.    Medication Adjustments/Labs and Tests Ordered: Current medicines are reviewed at length with the patient today.  Concerns regarding medicines are outlined above.  Orders Placed This Encounter  Procedures   EKG 12-Lead   No orders of the defined types were placed in this encounter.    No chief complaint on file.    History of Present Illness:    Vickie White is a  78 y.o. female.  Patient has past medical history of coronary artery calcification, mixed dyslipidemia.  Her blood pressure is elevated today.  She has noticed that she has chest tightness in the early hours of the morning.  No orthopnea or PND.  She is an active lady but does not exercise on a regular basis.  At the time of my evaluation, the patient is alert awake oriented and in no distress.  Past Medical History:  Diagnosis Date   Chest pain 02/05/2020   Gastroenteritis 02/05/2020   GERD (gastroesophageal reflux disease)    Hypertensive urgency 02/05/2020   Iron deficiency anemia 01/09/2022   Primary osteoarthritis 01/09/2022   Rheumatoid arthritis ([facility])     Past Surgical History:  Procedure Laterality Date   ABDOMINAL HYSTERECTOMY  1980   LUMBAR DISC SURGERY  1988   SPINAL FUSION  1994   L4 and L5    Current Medications: Active Medications[1]   Allergies:   Demerol [meperidine], Penicillamine, Valsartan, and Penicillins   Social History   Socioeconomic History   Marital status: Married    Spouse name: Not on file   Number of children: Not on file   Years of education: Not on file   Highest education level: Not on file  Occupational History   Not on file  Tobacco Use   Smoking status: Never   Smokeless tobacco: Never  Substance and Sexual Activity   Alcohol use: No   Drug use:  No   Sexual activity: Not on file  Other Topics Concern   Not on file  Social History Narrative   Not on file   Social Drivers of Health   Tobacco Use: Low Risk (08/19/2024)   Patient History    Smoking Tobacco Use: Never    Smokeless Tobacco Use: Never    Passive Exposure: Not on file  Financial Resource Strain: Not on file  Food Insecurity: Not on file  Transportation Needs: Not on file  Physical Activity: Not on file  Stress: Not on file  Social Connections: Not on file  Depression (EYV7-0): Not on file  Alcohol Screen: Not on file  Housing: Not on file  Utilities: Not on file   Health Literacy: Not on file     Family History: The patient's family history is negative for Colon cancer.  ROS:   Please see the history of present illness.    All other systems reviewed and are negative.  EKGs/Labs/Other Studies Reviewed:    The following studies were reviewed today: .SABRAEKG Interpretation Date/Time:  Wednesday August 19 2024 15:35:56 EST Ventricular Rate:  118 PR Interval:  130 QRS Duration:  82 QT Interval:  326 QTC Calculation: 456 R Axis:   79  Text Interpretation: Sinus tachycardia with occasional Premature ventricular complexes Nonspecific ST abnormality Abnormal ECG No previous ECGs available Confirmed by Edwyna Backers [phone]) on 08/19/2024 3:41:40 PM     Recent Labs: No results found for requested labs within last 365 days.  Recent Lipid Panel No results found for: CHOL, TRIG, HDL, CHOLHDL, VLDL, LDLCALC, LDLDIRECT  Physical Exam:    VS:  BP (!) 150/100   Pulse (!) 118   Ht 5' 3.6 (1.615 m)   Wt 137 lb 12.8 oz (62.5 kg)   SpO2 98%   BMI 23.95 kg/m     Wt Readings from Last 3 Encounters:  08/19/24 137 lb 12.8 oz (62.5 kg)  04/20/22 136 lb 6.4 oz (61.9 kg)  01/16/22 141 lb 1.9 oz (64 kg)     GEN: Patient is in no acute distress HEENT: Normal NECK: No JVD; No carotid bruits LYMPHATICS: No lymphadenopathy CARDIAC: Hear sounds regular, 2/6 systolic murmur at the apex. RESPIRATORY:  Clear to auscultation without rales, wheezing or rhonchi  ABDOMEN: Soft, non-tender, non-distended MUSCULOSKELETAL:  No edema; No deformity  SKIN: Warm and dry NEUROLOGIC:  Alert and oriented x 3 PSYCHIATRIC:  Normal affect   Signed, Backers JONELLE Edwyna, MD  08/19/2024 4:01 PM    [facility]     [1]  Current Meds  Medication Sig   Multiple Vitamin (MULTIVITAMIN) tablet Take 1 tablet by mouth daily.   omeprazole (PRILOSEC) 20 MG capsule Take 20 mg by mouth as needed for other. Hiatal hernia   "

## 2024-08-29 ENCOUNTER — Ambulatory Visit: Payer: Self-pay | Admitting: Cardiology

## 2024-08-29 LAB — COMPREHENSIVE METABOLIC PANEL WITH GFR
ALT: 6 IU/L (ref 0–32)
AST: 11 IU/L (ref 0–40)
Albumin: 3.8 g/dL (ref 3.8–4.8)
Alkaline Phosphatase: 93 IU/L (ref 49–135)
BUN/Creatinine Ratio: 13 (ref 12–28)
BUN: 8 mg/dL (ref 8–27)
Bilirubin Total: 0.5 mg/dL (ref 0.0–1.2)
CO2: 23 mmol/L (ref 20–29)
Calcium: 8.9 mg/dL (ref 8.7–10.3)
Chloride: 102 mmol/L (ref 96–106)
Creatinine, Ser: 0.63 mg/dL (ref 0.57–1.00)
Globulin, Total: 2.6 g/dL (ref 1.5–4.5)
Glucose: 82 mg/dL (ref 70–99)
Potassium: 3.8 mmol/L (ref 3.5–5.2)
Sodium: 139 mmol/L (ref 134–144)
Total Protein: 6.4 g/dL (ref 6.0–8.5)
eGFR: 90 mL/min/1.73

## 2024-08-29 LAB — CBC
Hematocrit: 32.4 % — ABNORMAL LOW (ref 34.0–46.6)
Hemoglobin: 10.5 g/dL — ABNORMAL LOW (ref 11.1–15.9)
MCH: 26.1 pg — ABNORMAL LOW (ref 26.6–33.0)
MCHC: 32.4 g/dL (ref 31.5–35.7)
MCV: 80 fL (ref 79–97)
Platelets: 313 x10E3/uL (ref 150–450)
RBC: 4.03 x10E6/uL (ref 3.77–5.28)
RDW: 17.2 % — ABNORMAL HIGH (ref 11.7–15.4)
WBC: 6.8 x10E3/uL (ref 3.4–10.8)

## 2024-08-29 LAB — LIPID PANEL
Chol/HDL Ratio: 2.7 ratio (ref 0.0–4.4)
Cholesterol, Total: 206 mg/dL — ABNORMAL HIGH (ref 100–199)
HDL: 77 mg/dL
LDL Chol Calc (NIH): 121 mg/dL — ABNORMAL HIGH (ref 0–99)
Triglycerides: 43 mg/dL (ref 0–149)
VLDL Cholesterol Cal: 8 mg/dL (ref 5–40)

## 2024-08-29 LAB — VITAMIN B12: Vitamin B-12: 478 pg/mL (ref 232–1245)

## 2024-08-29 LAB — VITAMIN D 25 HYDROXY (VIT D DEFICIENCY, FRACTURES): Vit D, 25-Hydroxy: 23.2 ng/mL — ABNORMAL LOW (ref 30.0–100.0)

## 2024-08-29 LAB — TSH: TSH: 1.49 u[IU]/mL (ref 0.450–4.500)

## 2024-08-31 ENCOUNTER — Other Ambulatory Visit: Payer: Self-pay

## 2024-08-31 ENCOUNTER — Observation Stay (HOSPITAL_BASED_OUTPATIENT_CLINIC_OR_DEPARTMENT_OTHER)
Admission: EM | Admit: 2024-08-31 | Discharge: 2024-09-02 | Disposition: A | Attending: Emergency Medicine | Admitting: Emergency Medicine

## 2024-08-31 ENCOUNTER — Emergency Department (HOSPITAL_BASED_OUTPATIENT_CLINIC_OR_DEPARTMENT_OTHER)

## 2024-08-31 ENCOUNTER — Telehealth: Payer: Self-pay | Admitting: Cardiology

## 2024-08-31 ENCOUNTER — Encounter (HOSPITAL_BASED_OUTPATIENT_CLINIC_OR_DEPARTMENT_OTHER): Payer: Self-pay | Admitting: Emergency Medicine

## 2024-08-31 ENCOUNTER — Telehealth: Payer: Self-pay

## 2024-08-31 DIAGNOSIS — M069 Rheumatoid arthritis, unspecified: Secondary | ICD-10-CM | POA: Diagnosis present

## 2024-08-31 DIAGNOSIS — R079 Chest pain, unspecified: Secondary | ICD-10-CM | POA: Diagnosis present

## 2024-08-31 DIAGNOSIS — E782 Mixed hyperlipidemia: Secondary | ICD-10-CM | POA: Diagnosis not present

## 2024-08-31 DIAGNOSIS — I10 Essential (primary) hypertension: Secondary | ICD-10-CM | POA: Diagnosis not present

## 2024-08-31 DIAGNOSIS — Z79899 Other long term (current) drug therapy: Secondary | ICD-10-CM | POA: Diagnosis not present

## 2024-08-31 DIAGNOSIS — K219 Gastro-esophageal reflux disease without esophagitis: Secondary | ICD-10-CM | POA: Diagnosis not present

## 2024-08-31 DIAGNOSIS — I25119 Atherosclerotic heart disease of native coronary artery with unspecified angina pectoris: Secondary | ICD-10-CM | POA: Insufficient documentation

## 2024-08-31 DIAGNOSIS — Z7982 Long term (current) use of aspirin: Secondary | ICD-10-CM | POA: Insufficient documentation

## 2024-08-31 DIAGNOSIS — D509 Iron deficiency anemia, unspecified: Secondary | ICD-10-CM | POA: Diagnosis present

## 2024-08-31 DIAGNOSIS — I209 Angina pectoris, unspecified: Secondary | ICD-10-CM | POA: Diagnosis present

## 2024-08-31 DIAGNOSIS — I200 Unstable angina: Principal | ICD-10-CM

## 2024-08-31 LAB — CBC
HCT: 33.3 % — ABNORMAL LOW (ref 36.0–46.0)
Hemoglobin: 10.6 g/dL — ABNORMAL LOW (ref 12.0–15.0)
MCH: 25.5 pg — ABNORMAL LOW (ref 26.0–34.0)
MCHC: 31.8 g/dL (ref 30.0–36.0)
MCV: 80.2 fL (ref 80.0–100.0)
Platelets: 353 K/uL (ref 150–400)
RBC: 4.15 MIL/uL (ref 3.87–5.11)
RDW: 17 % — ABNORMAL HIGH (ref 11.5–15.5)
WBC: 7.1 K/uL (ref 4.0–10.5)
nRBC: 0 % (ref 0.0–0.2)

## 2024-08-31 LAB — BASIC METABOLIC PANEL WITH GFR
Anion gap: 11 (ref 5–15)
BUN: 10 mg/dL (ref 8–23)
CO2: 25 mmol/L (ref 22–32)
Calcium: 9.2 mg/dL (ref 8.9–10.3)
Chloride: 105 mmol/L (ref 98–111)
Creatinine, Ser: 0.57 mg/dL (ref 0.44–1.00)
GFR, Estimated: >60 mL/min
Glucose, Bld: 82 mg/dL (ref 70–99)
Potassium: 3.4 mmol/L — ABNORMAL LOW (ref 3.5–5.1)
Sodium: 140 mmol/L (ref 135–145)

## 2024-08-31 LAB — TROPONIN T, HIGH SENSITIVITY
Troponin T High Sensitivity: <15 ng/L (ref 0–19)
Troponin T High Sensitivity: <15 ng/L (ref 0–19)

## 2024-08-31 MED ORDER — METOPROLOL TARTRATE 25 MG PO TABS
100.0000 mg | ORAL_TABLET | Freq: Once | ORAL | Status: AC
  Administered 2024-08-31: 100 mg via ORAL
  Filled 2024-08-31: qty 4

## 2024-08-31 MED ORDER — ASPIRIN 81 MG PO CHEW
81.0000 mg | CHEWABLE_TABLET | Freq: Once | ORAL | Status: AC
  Administered 2024-08-31: 81 mg via ORAL
  Filled 2024-08-31: qty 1

## 2024-08-31 MED ORDER — HEPARIN SODIUM (PORCINE) 5000 UNIT/ML IJ SOLN: 60.0000 [IU]/kg | Freq: Once | INTRAMUSCULAR | Status: DC

## 2024-08-31 MED ORDER — HEPARIN BOLUS VIA INFUSION
3800.0000 [IU] | Freq: Once | INTRAVENOUS | Status: AC
  Administered 2024-08-31: 3800 [IU] via INTRAVENOUS

## 2024-08-31 MED ORDER — HEPARIN (PORCINE) 25000 UT/250ML-% IV SOLN
950.0000 [IU]/h | INTRAVENOUS | Status: DC
  Administered 2024-08-31: 750 [IU]/h via INTRAVENOUS
  Filled 2024-08-31 (×3): qty 250

## 2024-08-31 MED ORDER — HEPARIN (PORCINE) 25000 UT/250ML-% IV SOLN: 12.0000 [IU]/kg/h | INTRAVENOUS | Status: DC

## 2024-08-31 MED ORDER — NITROGLYCERIN 0.4 MG SL SUBL
0.4000 mg | SUBLINGUAL_TABLET | SUBLINGUAL | Status: DC | PRN
  Administered 2024-08-31 – 2024-09-01 (×4): 0.4 mg via SUBLINGUAL
  Filled 2024-08-31 (×3): qty 1

## 2024-08-31 MED ORDER — PANTOPRAZOLE SODIUM 40 MG PO TBEC
40.0000 mg | DELAYED_RELEASE_TABLET | Freq: Every day | ORAL | Status: DC
  Administered 2024-08-31 – 2024-09-02 (×2): 40 mg via ORAL
  Filled 2024-08-31 (×3): qty 1

## 2024-08-31 NOTE — ED Provider Notes (Signed)
 " [facility] Provider Note   CSN: [id number] Arrival date & time: 08/31/24  1109     Patient presents with: Chest Pain   Chest Pain  79 year old female with a history of hypertension, hyperlipidemia, coronary artery calcification, angina presents with chest pain.  She states that yesterday morning she woke up and developed a chest pressure.  She took a nitroglycerin  and it was relieved within moments.  This morning she woke up and experienced the same chest pressure.  It is a moderate sensation all over her chest with no radiation.  No sweating, nausea, shortness of breath, dizziness.  She took a nitroglycerin  but it did not help this morning.  The pain eventually abated after 3 hours and she is currently pain-free.  Patient took her baby aspirin  this morning.  She saw her cardiologist on December 31 and was advised to start a baby aspirin , continue nitroglycerin , and plan for CT coronary angiography as well as start an exercise regimen.  She has been walking daily for the past week or more without symptoms.  Non-smoker with no early family history of cardiovascular disease.    Prior to Admission medications  Medication Sig Start Date End Date Taking? Authorizing Provider  aspirin  EC 81 MG tablet Take 1 tablet (81 mg total) by mouth daily. Swallow whole. 08/19/24   Revankar, Jennifer SAUNDERS, MD  metoprolol  tartrate (LOPRESSOR ) 100 MG tablet Take 1 tablet (100 mg total) by mouth once. Take 2 hours prior to your CT if your heart rate is greater than 55 08/19/24 08/19/24  Revankar, Jennifer SAUNDERS, MD  Multiple Vitamin (MULTIVITAMIN) tablet Take 1 tablet by mouth daily.    [provider]  nitroGLYCERIN  (NITROSTAT ) 0.4 MG SL tablet Place 1 tablet (0.4 mg total) under the tongue every 5 (five) minutes as needed. 08/19/24 11/17/24  Revankar, Jennifer SAUNDERS, MD  omeprazole (PRILOSEC) 20 MG capsule Take 20 mg by mouth as needed for other. Hiatal hernia 12/21/21    [provider]    Allergies: Demerol [meperidine], Penicillamine, Valsartan, and Penicillins    Review of Systems  Cardiovascular:  Positive for chest pain.    Updated Vital Signs BP (!) 152/75   Pulse 78   Temp 98 F (36.7 C)   Resp 10   Ht 5' 3.5 (1.613 m)   Wt 62.5 kg   SpO2 100%   BMI 24.03 kg/m   Physical Exam   Constitutional: Patient appears well-developed and well-nourished. No distress.  HENT:  Head: Normocephalic and atraumatic.  Right Ear: External ear normal.  Left Ear: External ear normal.  Mouth/Throat: Oropharynx is clear and moist. No oropharyngeal exudate.  Eyes: Conjunctivae are normal. Pupils are equal, round, and reactive to light. Neck: Normal range of motion. Neck supple.  Cardiovascular: Normal rate, regular rhythm, normal heart sounds and intact distal pulses.   Pulmonary/Chest: Effort normal and breath sounds normal. No respiratory distress. No wheezes or rales.  Abdominal: Soft. Bowel sounds are normal. No distension or tenderness.  Musculoskeletal: Normal range of motion. No edema or tenderness.  Neurological: Patient is alert with normal muscle tone. Coordination normal.  Skin: Skin is warm and dry. No diaphoresis.  Psychiatric: Normal mood and affect. Normal behavior. Judgment and thought content normal.  Nursing note and vitals reviewed.  (all labs ordered are listed, but only abnormal results are displayed) Labs Reviewed  BASIC METABOLIC PANEL WITH GFR - Abnormal; Notable for the following components:      Result  Value   Potassium 3.4 (*)    All other components within normal limits  CBC - Abnormal; Notable for the following components:   Hemoglobin 10.6 (*)    HCT 33.3 (*)    MCH 25.5 (*)    RDW 17.0 (*)    All other components within normal limits  TROPONIN T, HIGH SENSITIVITY  TROPONIN T, HIGH SENSITIVITY    EKG: EKG Interpretation Date/Time:  Monday August 31 2024 11:21:07 EST Ventricular Rate:  71 PR  Interval:  142 QRS Duration:  91 QT Interval:  387 QTC Calculation: 421 R Axis:   41  Text Interpretation: Sinus rhythm , normal axis, normal intervals, no acute ischemia Confirmed by Arohi Salvatierra ([id number]) on 08/31/2024 12:57:28 PM  Radiology: No results found.  Procedures   Medications Ordered in the ED  aspirin  chewable tablet 81 mg (has no administration in time range)   This patient presents to the ED with chief complaint(s) of chest pain with pertinent past medical history of hypertension, hyperlipidemia, RA not on treatment. The complaint involves an extensive differential diagnosis and also carries with it a high risk of complications and morbidity.    Additional history obtained from family. I have also reviewed previous admission documents and recent cardiology note.  The differential diagnosis includes acute coronary syndrome, PE, pneumothorax, dissection, pneumonia, chest wall pain, costochondritis.  The initial management included labs, EKG, chest x-ray.  EKG is normal sinus rhythm with no acute ischemia.  Initial troponin less than 15.  Slight anemia at baseline.  Borderline potassium.  Chest x-ray official reading pending but negative by my interpretation.  Given second baby aspirin  as she took 1 this morning.  Consultation: - Consulted or discussed management/test interpretation with external professional: Cardiology recommends admit to hospitalist and start heparin  drip.  Patient is amenable to this.  She remains pain-free.  Discussed with Dr. Claudene, hospitalist who will admit patient.  Consideration for admission or further workup: Warrants admission  Social Determinants of health: None     Final diagnoses:  Unstable angina [facility])    ED Discharge Orders     None          Fredia Rosette Kirsch, MD 08/31/24 1450  "

## 2024-08-31 NOTE — Telephone Encounter (Signed)
 Dr. Liborio spoke with MD.

## 2024-08-31 NOTE — Telephone Encounter (Signed)
 ER dr is calling to speak with our dr. Please advise

## 2024-08-31 NOTE — Progress Notes (Signed)
 PHARMACY - ANTICOAGULATION CONSULT NOTE  Pharmacy Consult for heparin  Indication: chest pain/ACS  Allergies[1]  Patient Measurements: Height: 5' 3.5 (161.3 cm) Weight: 62.5 kg (137 lb 12.8 oz) IBW/kg (Calculated) : 53.55 HEPARIN  DW (KG): 62.5  Vital Signs: Temp: 98 F (36.7 C) (01/12 1347) Temp Source: Oral (01/12 1118) BP: 152/75 (01/12 1347) Pulse Rate: 78 (01/12 1347)  Labs: Recent Labs    08/31/24 1124  HGB 10.6*  HCT 33.3*  PLT 353  CREATININE 0.57    Estimated Creatinine Clearance: 47.5 mL/min (by C-G formula based on SCr of 0.57 mg/dL).   Medical History: Past Medical History:  Diagnosis Date   Chest pain 02/05/2020   Gastroenteritis 02/05/2020   GERD (gastroesophageal reflux disease)    Hypertensive urgency 02/05/2020   Iron deficiency anemia 01/09/2022   Primary osteoarthritis 01/09/2022   Rheumatoid arthritis ([facility])     Medications:  Infusions:   heparin  750 Units/hr (08/31/24 1428)    Assessment: 78 yof presented to the ED with CP. To start IV heparin . Baseline Hgb is slightly low but platelets are WNL. She is not on anticoagulation PTA.   Goal of Therapy:  Heparin  level 0.3-0.7 units/ml Monitor platelets by anticoagulation protocol: Yes   Plan:  Heparin  bolus 3800 units IV x 1 Heparin  gtt 750units/hr Check an 8 hr heparin  level Daily heparin  level and CBC  Zaina Jenkin, Vernell Helling 08/31/2024,2:29 PM      [1]  Allergies Allergen Reactions   Demerol [Meperidine] Nausea And Vomiting   Penicillamine     Other reaction(s): Unknown   Valsartan     Other reaction(s): severe jt pain   Penicillins Rash

## 2024-08-31 NOTE — ED Triage Notes (Signed)
 Pt reports chest pressure since yesterday. Denies n/v/diaphoresis.    Took SL nitroglycerin  appx 0830 today, no relief.  Reports taking one yesterday with some relief.

## 2024-08-31 NOTE — Telephone Encounter (Signed)
 When calling patient for results she states that she has been having tightness/pressure in her chest. Ntg has helped. Advised to go to the ED for evaluation. Pt is agreeable.

## 2024-08-31 NOTE — Plan of Care (Addendum)
 Vickie White is a 79 year old female with pmh HTN, HLD, CAD who presented with complaints of chest pain.  Followed by Dr. Edwyna in outpatient setting and previously had coronary CT back in 01/2022 with coronary calcium  score of 0 at that time.  Patient had been having chest pain with plans of repeat study.  However chest pain today not relieved with nitroglycerin  for which she into the emergency department for further evaluation.  High-sensitivity troponins negative x 2.  EKG reportedly did not show significant ischemic changes.  Case reportedly discussed with Dr. Revankar who recommended starting patient on heparin  drip.  Orders placed for observation for chest pain.  Will need to formally consult cardiology once patient arrives.

## 2024-08-31 NOTE — ED Notes (Signed)
 IV heparin  switched from R AC to L forearm IV per pt request. R AC IV saline flushed and locked.

## 2024-09-01 DIAGNOSIS — R072 Precordial pain: Secondary | ICD-10-CM

## 2024-09-01 DIAGNOSIS — I160 Hypertensive urgency: Secondary | ICD-10-CM

## 2024-09-01 DIAGNOSIS — Z743 Need for continuous supervision: Secondary | ICD-10-CM | POA: Diagnosis not present

## 2024-09-01 DIAGNOSIS — R079 Chest pain, unspecified: Secondary | ICD-10-CM | POA: Diagnosis present

## 2024-09-01 DIAGNOSIS — I200 Unstable angina: Secondary | ICD-10-CM | POA: Diagnosis present

## 2024-09-01 DIAGNOSIS — R531 Weakness: Secondary | ICD-10-CM | POA: Diagnosis not present

## 2024-09-01 LAB — BASIC METABOLIC PANEL WITH GFR
Anion gap: 8 (ref 5–15)
BUN: 8 mg/dL (ref 8–23)
CO2: 27 mmol/L (ref 22–32)
Calcium: 8.7 mg/dL — ABNORMAL LOW (ref 8.9–10.3)
Chloride: 105 mmol/L (ref 98–111)
Creatinine, Ser: 0.47 mg/dL (ref 0.44–1.00)
GFR, Estimated: >60 mL/min
Glucose, Bld: 86 mg/dL (ref 70–99)
Potassium: 3.4 mmol/L — ABNORMAL LOW (ref 3.5–5.1)
Sodium: 140 mmol/L (ref 135–145)

## 2024-09-01 LAB — LIPID PANEL
Cholesterol: 191 mg/dL (ref 0–200)
HDL: 63 mg/dL
LDL Cholesterol: 114 mg/dL — ABNORMAL HIGH (ref 0–99)
Total CHOL/HDL Ratio: 3 ratio
Triglycerides: 69 mg/dL
VLDL: 14 mg/dL (ref 0–40)

## 2024-09-01 LAB — CBC
HCT: 32.8 % — ABNORMAL LOW (ref 36.0–46.0)
Hemoglobin: 10.4 g/dL — ABNORMAL LOW (ref 12.0–15.0)
MCH: 25.1 pg — ABNORMAL LOW (ref 26.0–34.0)
MCHC: 31.7 g/dL (ref 30.0–36.0)
MCV: 79.2 fL — ABNORMAL LOW (ref 80.0–100.0)
Platelets: 342 K/uL (ref 150–400)
RBC: 4.14 MIL/uL (ref 3.87–5.11)
RDW: 16.9 % — ABNORMAL HIGH (ref 11.5–15.5)
WBC: 6 K/uL (ref 4.0–10.5)
nRBC: 0 % (ref 0.0–0.2)

## 2024-09-01 LAB — HEPARIN LEVEL (UNFRACTIONATED)
Heparin Unfractionated: 0.17 [IU]/mL — ABNORMAL LOW (ref 0.30–0.70)
Heparin Unfractionated: 0.32 [IU]/mL (ref 0.30–0.70)
Heparin Unfractionated: <0.1 [IU]/mL — ABNORMAL LOW (ref 0.30–0.70)

## 2024-09-01 LAB — TROPONIN T, HIGH SENSITIVITY: Troponin T High Sensitivity: <15 ng/L (ref 0–19)

## 2024-09-01 LAB — MRSA NEXT GEN BY PCR, NASAL: MRSA by PCR Next Gen: NOT DETECTED

## 2024-09-01 MED ORDER — ATORVASTATIN CALCIUM 10 MG PO TABS
20.0000 mg | ORAL_TABLET | Freq: Every day | ORAL | Status: DC
  Administered 2024-09-01 – 2024-09-02 (×2): 20 mg via ORAL
  Filled 2024-09-01 (×2): qty 2

## 2024-09-01 MED ORDER — ACETAMINOPHEN 325 MG PO TABS: 650.0000 mg | ORAL_TABLET | ORAL | Status: DC | PRN

## 2024-09-01 MED ORDER — POTASSIUM CHLORIDE CRYS ER 20 MEQ PO TBCR
40.0000 meq | EXTENDED_RELEASE_TABLET | Freq: Once | ORAL | Status: AC
  Administered 2024-09-01: 40 meq via ORAL
  Filled 2024-09-01: qty 2

## 2024-09-01 MED ORDER — AMLODIPINE BESYLATE 5 MG PO TABS
5.0000 mg | ORAL_TABLET | Freq: Every day | ORAL | Status: DC
  Administered 2024-09-01 – 2024-09-02 (×2): 5 mg via ORAL
  Filled 2024-09-01 (×2): qty 1

## 2024-09-01 MED ORDER — ONDANSETRON HCL 4 MG/2ML IJ SOLN: 4.0000 mg | Freq: Four times a day (QID) | INTRAMUSCULAR | Status: DC | PRN

## 2024-09-01 MED ORDER — ASPIRIN 81 MG PO TBEC
81.0000 mg | DELAYED_RELEASE_TABLET | Freq: Every day | ORAL | Status: DC
  Administered 2024-09-01 – 2024-09-02 (×2): 81 mg via ORAL
  Filled 2024-09-01 (×2): qty 1

## 2024-09-01 MED ORDER — HEPARIN BOLUS VIA INFUSION
2000.0000 [IU] | Freq: Once | INTRAVENOUS | Status: AC
  Administered 2024-09-01: 2000 [IU] via INTRAVENOUS

## 2024-09-01 NOTE — H&P (Signed)
 "  Telemedicine History and Physical    Patient: Vickie White FMW:[id number] DOB: 09/08/1945 DOA: 08/31/2024 DOS: the patient was seen and examined on 09/01/2024 PCP: Georgia Norleen FALCON., MD  Patient coming from: Home  Referring Provider: Rosette Dais, MD Telemedicine Provider: Burnard Cunning, DO Patient Location: [facility] ED Referring Diagnosis: Pneumonia with Acute Hypoxic Respiratory Failure Patient Name and DOB verified: Vickie White, 23-Jan-1946 Patient consented to Telemedicine Evaluation: Yes RN virtual assistant: Darryle Lunger, RN Video encounter time and date: 09/01/2024 11:25 AM    Chief Complaint:  Chief Complaint  Patient presents with   Chest Pain   HPI: Vickie White is a 79 y.o. female with medical history significant of  HTN, HLD, RA not on treatment CAD with angina who presented to [facility] ED for evaluation of chest pain.   Patient reports that after getting up past couple mornings after she has been up for about 1 hour, she will feel tightness and pressure in her chest.  Yesterday morning she took sublingual nitroglycerin  and it helped, this morning she did the same but did not get relief, so she presented to the ED for evaluation yesterday morning.  She denies sharp or stabbing pain, states it feels like a lot of pressure, moderate in severity.  Pain does not radiate.  She denies associated shortness of breath, dizziness or lightheadedness, nausea or diaphoresis.  Patient reports being seen by her cardiologist on December 31, was advised to take baby aspirin  daily and continue using nitroglycerin  as needed, a CT coronary angio was scheduled for later this month.  Patient denies history of smoking or family history of cardiovascular disease.  Patient reports otherwise recently being in good health with no recent fever/chills, sore throat cough or congestion, no abdominal pain nausea vomiting diarrhea or UTI symptoms.    During virtual admission encounter this morning, patient is  chest pain-free 2 hours after being given another dose of sublingual nitroglycerin  for recurrent chest pain that was 8 out of 10 in severity.  Repeat EKG and troponin remain negative.  Vital stable but with BP mildly elevated 149/77.  ED course- Initial vitals-temp 98.1 F, HR 74, RR 19, initial BP 181/92, later 152/75.  BPs today have been labile with systolic from 131-171.  SpO2 100% on room air. Labs obtained including BMP and CBC were notable for potassium 3.4, hemoglobin stable at 10.6. Troponin < 15 x 2.  Third troponin this morning also < 15. EKG showed normal sinus rhythm at 71 bpm, normal axis and normal intervals with no acute ischemic changes..  EKG personally reviewed. Repeat EKG this morning remains nonacute and normal sinus.  Patient was treated in the ED with 81 mg aspirin , 100 mg p.o. Lopressor , 40 mg p.o. Protonix , sublingual nitroglycerin  x 2 doses yesterday evening, 2:13 AM today and 9:34 AM today.  Heparin  started per pharmacy.  ED provider discussed the case with on-call cardiologist who recommended starting IV heparin  and will see the patient in consultation upon arrival to the hospital.  Patient is admitted to [facility] service on telemetry for observation and further evaluation of chest pain as outlined in detail below.    Review of Systems: As mentioned in the history of present illness. All other systems reviewed and are negative.   Past Medical History:  Diagnosis Date   Chest pain 02/05/2020   Gastroenteritis 02/05/2020   GERD (gastroesophageal reflux disease)    Hypertensive urgency 02/05/2020   Iron deficiency anemia 01/09/2022   Primary osteoarthritis 01/09/2022  Rheumatoid arthritis (HCC)    Past Surgical History:  Procedure Laterality Date   ABDOMINAL HYSTERECTOMY  1980   LUMBAR DISC SURGERY  1988   SPINAL FUSION  1994   L4 and L5   Social History:  reports that she has never smoked. She has never used smokeless tobacco. She reports that she does not drink  alcohol and does not use drugs.  Allergies[1]  Family History  Problem Relation Age of Onset   Colon cancer Neg Hx     Prior to Admission medications  Medication Sig Start Date End Date Taking? Authorizing Provider  aspirin  EC 81 MG tablet Take 1 tablet (81 mg total) by mouth daily. Swallow whole. 08/19/24  Yes Revankar, Rajan R, MD  Ferrous Sulfate (IRON PO) Take 1 tablet by mouth daily.   Yes [provider]  Multiple Vitamin (MULTIVITAMIN) tablet Take 1 tablet by mouth daily.   Yes [provider]  nitroGLYCERIN  (NITROSTAT ) 0.4 MG SL tablet Place 1 tablet (0.4 mg total) under the tongue every 5 (five) minutes as needed. 08/19/24 11/17/24 Yes Revankar, Jennifer SAUNDERS, MD  omeprazole (PRILOSEC) 20 MG capsule Take 20 mg by mouth as needed for other. Hiatal hernia Patient not taking: Reported on 08/31/2024 12/21/21   [provider]    Physical Exam: Vitals:   09/01/24 1200 09/01/24 1215 09/01/24 1230 09/01/24 1318  BP: (!) 151/66  136/88 (!) 146/69  Pulse: 91 82 77 76  Resp: (!) 21 13 19 19   Temp:      TempSrc:      SpO2: 99% 97% 100% 100%  Weight:      Height:       Bedside physical exam was performed by RN listed above. Below exam findings are based on their in person physical exam findings and my observations during virtual encounter.  General exam: awake, alert, no acute distress HEENT: Voice clear, hearing grossly normal  Respiratory system: CTAB no wheezes, rales or rhonchi, normal respiratory effort. Cardiovascular system: normal S1/S2, RRR, no peripheral edema Gastrointestinal system: soft, NT, ND, +bowel sounds. Central nervous system: A&O x4. no gross focal neurologic deficits, normal speech Extremities: moves all, no edema Skin: dry, intact, normal temperature per RN Psychiatry: normal mood, congruent affect, judgement and insight appear normal   Data Reviewed:  As reviewed in detail above  Assessment and Plan:  * Chest pain Patient  having persistent anginal chest pain and requiring repeated sublingual nitroglycerin  doses.  Troponin remains negative x 3.  EKGs have been nonacute.  Outpatient coronary CTA was planned for later this month for evaluation of angina that she was seen by her cardiologist for on 12/31.  Prior coronary CT back in June 2023 calcium  score was 0. --Cardiology is consulted --Continue IV heparin  --Monitor on telemetry --Sublingual nitroglycerin  as needed --Continue aspirin  81 mg daily --Repeat EKG and troponin for active chest pain  Mixed dyslipidemia Recent lipid panel on 08/28/2024 showed total cholesterol 206, LDL 121, HDL 77, triglycerides 43.  Not yet on statin. -- Start Lipitor 20 mg daily  Iron deficiency anemia Heme globin stable 10.6 Continue iron supplement  Rheumatoid arthritis (HCC) Stable without active flare symptoms reported.  Not on medications.  Monitor.  GERD (gastroesophageal reflux disease) Continue PPI  Angina pectoris See chest pain      Advance Care Planning:   Code Status: Full Code  Patient states that she would want CPR, defibrillation, ACLS meds and intubation/mechanical ventilation in the event of cardiac or respiratory arrest.  Consults: Cardiology  Family Communication: Daughter was present at bedside during virtual admission encounter  Severity of Illness: The appropriate patient status for this patient is OBSERVATION. Observation status is judged to be reasonable and necessary in order to provide the required intensity of service to ensure the patient's safety. The patient's presenting symptoms, physical exam findings, and initial radiographic and laboratory data in the context of their medical condition is felt to place them at decreased risk for further clinical deterioration. Furthermore, it is anticipated that the patient will be medically stable for discharge from the hospital within 2 midnights of admission.   Author: Burnard DELENA Cunning,  DO 09/01/2024 1:50 PM  For on call review [email or web address].      [1]  Allergies Allergen Reactions   Demerol [Meperidine] Nausea And Vomiting   Penicillamine     Other reaction(s): Unknown   Valsartan     Other reaction(s): severe jt pain   Penicillins Rash   "

## 2024-09-01 NOTE — Assessment & Plan Note (Signed)
 Recent lipid panel on 08/28/2024 showed total cholesterol 206, LDL 121, HDL 77, triglycerides 43.  Not yet on statin. -- Start Lipitor 20 mg daily

## 2024-09-01 NOTE — Assessment & Plan Note (Signed)
 Patient having persistent anginal chest pain and requiring repeated sublingual nitroglycerin  doses.  Troponin remains negative x 3.  EKGs have been nonacute.  Outpatient coronary CTA was planned for later this month for evaluation of angina that she was seen by her cardiologist for on 12/31.  Prior coronary CT back in June 2023 calcium  score was 0. --Cardiology is consulted --Continue IV heparin  --Monitor on telemetry --Sublingual nitroglycerin  as needed --Continue aspirin  81 mg daily --Repeat EKG and troponin for active chest pain

## 2024-09-01 NOTE — Assessment & Plan Note (Signed)
See chest pain 

## 2024-09-01 NOTE — Assessment & Plan Note (Signed)
 Continue PPI

## 2024-09-01 NOTE — Consult Note (Addendum)
 "  Cardiology Consultation   Patient ID: Vickie White MRN: [id number]; DOB: [birth year]  Admit date: 08/31/2024 Date of Consult: 09/01/2024  PCP:  Georg[place] Norleen FALCON., MD   [facility] Providers Cardiologist:  Dr. Edwyna   Patient Profile: Vickie White is a 79 y.o. female with a hx of non-obstructive CAD,  who is being seen 09/01/2024 for the evaluation of chest pain at the request of Dr Fausto.  History of Present Illness: Vickie White with above PMH who presented to ER for chest pain. Pain started Sunday 08/30/24 morning when she woke up. She was drinking coffee and felt chest pressure.  It's not radiating, denied any diaphoresis, SOB, dizziness, syncope. She had taken SL nitroglycerin  and the pain went away. She had recurrent similar chest pain Monday morning. She took SL nitroglycerin  again, it relieved the pain. She came to the ER eventually due to recurrent pain, she took third dose nitroglycerin  at ED. Pain has resolved since.  She follows Dr Monetta and  Dr Edwyna outpatient, had coronary CT done 02/16/22 showed Coronary calcium  score of 0.  Nonobstructive CAD, with noncalcified plaque in proximal RCA causing minimal (0-24%) stenosis. She was last seen 08/19/24, c/o chest pain, was arranged for repeat coronary CT. She is on ASA 81mg  at baseline. She is not on any antihypertensives at baseline.   At ER, she had labs showing K 3.4 and Hgb 10.6. Hs trop < x3. EKG showed sinus rhythm with PAC. She was hypertensive 180s systolic initially. She was given ASA, metoprolol  100mg , potassium, and started on heaprin gtt at ER. On call cardiology was reached out and she was recommended heparin  gtt. Now she is transferred to [facility] for cardiology to see.    Past Medical History:  Diagnosis Date   Chest pain 02/05/2020   Gastroenteritis 02/05/2020   GERD (gastroesophageal reflux disease)    Hypertensive urgency 02/05/2020   Iron deficiency anemia 01/09/2022   Primary osteoarthritis  01/09/2022   Rheumatoid arthritis (HCC)     Past Surgical History:  Procedure Laterality Date   ABDOMINAL HYSTERECTOMY  1980   LUMBAR DISC SURGERY  1988   SPINAL FUSION  1994   L4 and L5     Home Medications:  Prior to Admission medications  Medication Sig Start Date End Date Taking? Authorizing Provider  aspirin  EC 81 MG tablet Take 1 tablet (81 mg total) by mouth daily. Swallow whole. 08/19/24  Yes Revankar, Rajan R, MD  Ferrous Sulfate (IRON PO) Take 1 tablet by mouth daily.   Yes [provider]  Multiple Vitamin (MULTIVITAMIN) tablet Take 1 tablet by mouth daily.   Yes [provider]  nitroGLYCERIN  (NITROSTAT ) 0.4 MG SL tablet Place 1 tablet (0.4 mg total) under the tongue every 5 (five) minutes as needed. 08/19/24 11/17/24 Yes Revankar, Jennifer SAUNDERS, MD  omeprazole (PRILOSEC) 20 MG capsule Take 20 mg by mouth as needed for other. Hiatal hernia Patient not taking: Reported on 08/31/2024 12/21/21   [provider]    Scheduled Meds:  aspirin  EC  81 mg Oral Daily   atorvastatin   20 mg Oral Daily   pantoprazole   40 mg Oral Daily   Continuous Infusions:  heparin  950 Units/hr (09/01/24 0146)   PRN Meds: acetaminophen , nitroGLYCERIN , ondansetron  (ZOFRAN ) IV  Allergies:   Allergies[1]  Social History:   Social History   Socioeconomic History   Marital status: Married    Spouse name: Not on file   Number of children: Not on file  Years of education: Not on file   Highest education level: Not on file  Occupational History   Not on file  Tobacco Use   Smoking status: Never   Smokeless tobacco: Never  Substance and Sexual Activity   Alcohol use: No   Drug use: No   Sexual activity: Not on file  Other Topics Concern   Not on file  Social History Narrative   Not on file   Social Drivers of Health   Tobacco Use: Low Risk (08/31/2024)   Patient History    Smoking Tobacco Use: Never    Smokeless Tobacco Use: Never    Passive Exposure: Not on  file  Financial Resource Strain: Not on file  Food Insecurity: No Food Insecurity (09/01/2024)   Epic    Worried About Programme Researcher, Broadcasting/film/video in the Last Year: Never true    Ran Out of Food in the Last Year: Never true  Transportation Needs: No Transportation Needs (09/01/2024)   Epic    Lack of Transportation (Medical): No    Lack of Transportation (Non-Medical): No  Physical Activity: Not on file  Stress: Not on file  Social Connections: Moderately Integrated (09/01/2024)   Social Connection and Isolation Panel    Frequency of Communication with Friends and Family: More than three times a week    Frequency of Social Gatherings with Friends and Family: More than three times a week    Attends Religious Services: More than 4 times per year    Active Member of [employer] or Organizations: Yes    Attends Banker Meetings: More than 4 times per year    Marital Status: Widowed  Intimate Partner Violence: Not At Risk (09/01/2024)   Epic    Fear of Current or Ex-Partner: No    Emotionally Abused: No    Physically Abused: No    Sexually Abused: No  Depression (PHQ2-9): Not on file  Alcohol Screen: Not on file  Housing: Low Risk (09/01/2024)   Epic    Unable to Pay for Housing in the Last Year: No    Number of Times Moved in the Last Year: 0    Homeless in the Last Year: No  Utilities: Not At Risk (09/01/2024)   Epic    Threatened with loss of utilities: No  Health Literacy: Not on file    Family History:    Family History  Problem Relation Age of Onset   Colon cancer Neg Hx      ROS:  Constitutional: Denied fever, chills, malaise, night sweats Eyes: Denied vision change or loss Ears/Nose/Mouth/Throat: Denied ear ache, sore throat, coughing, sinus pain Cardiovascular: see HPI  Respiratory: no SOB or coughing  Gastrointestinal: Denied nausea, vomiting, abdominal pain, diarrhea Genital/Urinary: Denied dysuria, hematuria, urinary frequency/urgency Musculoskeletal: Denied  muscle ache, joint pain, weakness Skin: Denied rash, wound Neuro: Denied headache, dizziness, syncope Psych: Denied history of depression/anxiety  Endocrine: Denied history of diabetes   Physical Exam/Data: Vitals:   09/01/24 1215 09/01/24 1230 09/01/24 1318 09/01/24 1437  BP:  136/88 (!) 146/69 (!) 148/83  Pulse: 82 77 76 87  Resp: 13 19 19 19   Temp:    98.1 F (36.7 C)  TempSrc:    Oral  SpO2: 97% 100% 100% 97%  Weight:    60.6 kg  Height:    5' 3 (1.6 m)    Intake/Output Summary (Last 24 hours) at 09/01/2024 1704 Last data filed at 09/01/2024 1603 Gross per 24 hour  Intake 504.66 ml  Output --  Net 504.66 ml      09/01/2024    2:37 PM 08/31/2024   11:16 AM 08/19/2024    3:34 PM  Last 3 Weights  Weight (lbs) 133 lb 9.6 oz 137 lb 12.8 oz 137 lb 12.8 oz  Weight (kg) 60.6 kg 62.506 kg 62.506 kg     Body mass index is 23.67 kg/m.  Vitals:  Vitals:   09/01/24 1318 09/01/24 1437  BP: (!) 146/69 (!) 148/83  Pulse: 76 87  Resp: 19 19  Temp:  98.1 F (36.7 C)  SpO2: 100% 97%   General Appearance: In no apparent distress, laying in bed HEENT: Normocephalic, atraumatic.  Neck: Supple, trachea midline, no JVDs Cardiovascular: Regular rate and rhythm, normal S1-S2,  no murmur  Respiratory: Resting breathing unlabored, lungs sounds clear to auscultation bilaterally, no use of accessory muscles. On room air.   Gastrointestinal: Bowel sounds positive, abdomen soft Extremities: Able to move all extremities in bed without difficulty, no edema of BLE. Radial pulse 2+ bilaterally  Musculoskeletal: Normal muscle bulk and tone Skin: Intact, warm, dry.  Neurologic: Alert, oriented to person, place and time.  no gross focal neuro deficit Psychiatric: Normal affect. Mood is appropriate.    EKG:  The EKG was personally reviewed and demonstrates:    EKG from 09/01/24 showed sinus rhythm 78bpm, non-specific T wave abnormalities on lateral leads   EKG from 08/31/24 showed sinus  rhythm 71bpm, artifacts   Telemetry:  Telemetry was personally reviewed and demonstrates:    Sinus rhythm, occasional PVC  Relevant CV Studies:   Coronary CT from 02/16/22 showed:   IMPRESSION: 1. Coronary calcium  score of 0.   2. Normal coronary origin with right dominance.   3. Nonobstructive CAD, with noncalcified plaque in proximal RCA causing minimal (0-24%) stenosis   CAD-RADS 1. Minimal non-obstructive CAD (0-24%). Consider non-atherosclerotic causes of chest pain. Consider preventive therapy and risk factor modification.     Laboratory Data: High Sensitivity Troponin:  No results for input(s): TROPONINIHS in the last 720 hours.  Recent Labs  Lab 08/31/24 1124 08/31/24 1317 09/01/24 1158  TRNPT <15 <15 <15      Chemistry Recent Labs  Lab 08/28/24 0947 08/31/24 1124 09/01/24 0628  NA 139 140 140  K 3.8 3.4* 3.4*  CL 102 105 105  CO2 23 25 27   GLUCOSE 82 82 86  BUN 8 10 8   CREATININE 0.63 0.57 0.47  CALCIUM  8.9 9.2 8.7*  GFRNONAA  --  >60 >60  ANIONGAP  --  11 8    Recent Labs  Lab 08/28/24 0947  PROT 6.4  ALBUMIN 3.8  AST 11  ALT 6  ALKPHOS 93  BILITOT 0.5   Lipids  Recent Labs  Lab 08/28/24 0947  CHOL 206*  TRIG 43  HDL 77  LABVLDL 8  LDLCALC 121*  CHOLHDL 2.7    Hematology Recent Labs  Lab 08/28/24 0947 08/31/24 1124 09/01/24 1158  WBC 6.8 7.1 6.0  RBC 4.03 4.15 4.14  HGB 10.5* 10.6* 10.4*  HCT 32.4* 33.3* 32.8*  MCV 80 80.2 79.2*  MCH 26.1* 25.5* 25.1*  MCHC 32.4 31.8 31.7  RDW 17.2* 17.0* 16.9*  PLT 313 353 342   Thyroid  Recent Labs  Lab 08/28/24 0947  TSH 1.490    BNPNo results for input(s): BNP, PROBNP in the last 168 hours.  DDimer No results for input(s): DDIMER in the last 168 hours.  Radiology/Studies:  DG Chest 2 View Result Date: 08/31/2024  EXAM: 2 VIEW(S) XRAY OF THE CHEST 08/31/2024 01:08:00 PM COMPARISON: 02/11/2023 CLINICAL HISTORY: chest pressure FINDINGS: LUNGS AND PLEURA: No focal  pulmonary opacity. No pleural effusion. No pneumothorax. HEART AND MEDIASTINUM: No acute abnormality of the cardiac and mediastinal silhouettes. BONES AND SOFT TISSUES: No acute osseous abnormality. IMPRESSION: 1. No acute cardiopulmonary abnormality. Electronically signed by: Waddell Calk MD MD 08/31/2024 02:55 PM EST RP Workstation: [id number]     Assessment and Plan:  Chest pain - presented with chest pain at rest, relieved by nitroglycerin  - gated CT 2023 with coronary Ca score 0, non-obstructive CAD on the pRCA - BP was elevated 180s yesterday, maybe contributing - Hs trop negative x3. EKG without acute finding - will plan for coronary CT tomrorow  - will obtain Echo, lipid panel, Hgb A1C  - repeat EKG and trop if needed   - continue ASA, heparin  gtt, and PRN nitroglycerin  overnight; primary started lipitor 20mg    HTN urgency  - BP was 180s systolic initially, she reports low BP at 100s systolic at baseline, now 148/90 during encounter - will start amlodipine  5mg  daily  - trend BP    Risk Assessment/Risk Scores:     For questions or updates, please contact [facility] Please consult [email or web address] for contact info under      Signed, Xika Zhao, NP  09/01/2024 5:04 PM   Patient seen and examined, note reviewed with the signed Advanced Practice Provider. I personally reviewed laboratory data, imaging studies and relevant notes. I independently examined the patient and formulated the important aspects of the plan. I have personally discussed the plan with the patient and/or family. Comments or changes to the note/plan are indicated below.  Patient profile: This is an 79 year old female with history of nonobstructive CAD only were consulted for chest pain.  Patient has been having chest pain upon awakening for the past 2 weeks which has been improving after taking nitroglycerin .  She also has had increased amount of stress and has also been taking care of her  79 year old woman.  She does not get any chest pain during the day or while taking care of the other person.  She also is very active and walks frequently and does not get any chest pain with activity.  Typically does not have high blood pressure but has been very stressed lately believes this is leading to her blood pressure  My Exam:  Physical Exam Vitals and nursing note reviewed.  Constitutional:      Appearance: Normal appearance.  HENT:     Head: Normocephalic and atraumatic.  Eyes:     Conjunctiva/sclera: Conjunctivae normal.  Neck:     Vascular: No carotid bruit.  Cardiovascular:     Rate and Rhythm: Normal rate and regular rhythm.  Pulmonary:     Effort: Pulmonary effort is normal.     Breath sounds: Normal breath sounds.  Musculoskeletal:        General: No swelling or tenderness.  Skin:    Coloration: Skin is not jaundiced or pale.  Neurological:     Mental Status: She is alert.      Telemetry: Normal sinus rhythm- Personally reviewed  Assessment & Plan:  Atypical chest pain, possibly related to hypertension-EKG without pathologic ST changes and troponin negative x 3.  Coronary calcium  score of 0 in 2023 with nonobstructive disease.  Will discontinue heparin  and plan for cardiac CTA tomorrow with beta-blocker prior.  Will start on amlodipine  which can also be an antianginal.  Echocardiogram. Uncontrolled  hypertension-started on amlodipine  Nonobstructive CAD by CT   Signed, Emeline Calender, DO [place]  [facility]  09/01/2024 8:13 PM       [1]  Allergies Allergen Reactions   Demerol [Meperidine] Nausea And Vomiting   Penicillamine     Other reaction(s): Unknown   Valsartan     Other reaction(s): severe jt pain   Penicillins Rash   "

## 2024-09-01 NOTE — Assessment & Plan Note (Signed)
 Stable without active flare symptoms reported.  Not on medications.  Monitor.

## 2024-09-01 NOTE — Progress Notes (Signed)
 PHARMACY - ANTICOAGULATION  Pharmacy Consult for heparin  Indication: chest pain/ACS Brief A/P: Heparin  level subtherapeutic Increase Heparin  rate  Allergies[1]  Patient Measurements: Height: 5' 3.5 (161.3 cm) Weight: 62.5 kg (137 lb 12.8 oz) IBW/kg (Calculated) : 53.55 HEPARIN  DW (KG): 62.5  Vital Signs: Temp: 97.9 F (36.6 C) (01/12 2358) Temp Source: Oral (01/12 2358) BP: 159/83 (01/12 2330) Pulse Rate: 67 (01/12 2330)  Labs: Recent Labs    08/31/24 1124 08/31/24 2346  HGB 10.6*  --   HCT 33.3*  --   PLT 353  --   HEPARINUNFRC  --  0.17*  CREATININE 0.57  --     Estimated Creatinine Clearance: 47.5 mL/min (by C-G formula based on SCr of 0.57 mg/dL).  Assessment: 79 y.o. female with chest pain for heparin    Goal of Therapy:  Heparin  level 0.3-0.7 units/ml Monitor platelets by anticoagulation protocol: Yes   Plan:  Heparin  2000 units IV bolus, then increase heparin   950 units/hr Check heparin  level in 8 hours.   Esta Carmon, Cordella Misty 09/01/2024,1:39 AM       [1]  Allergies Allergen Reactions   Demerol [Meperidine] Nausea And Vomiting   Penicillamine     Other reaction(s): Unknown   Valsartan     Other reaction(s): severe jt pain   Penicillins Rash

## 2024-09-01 NOTE — Progress Notes (Signed)
 PHARMACY - ANTICOAGULATION CONSULT NOTE  Pharmacy Consult for heparin  Indication: chest pain/ACS  Allergies[1]  Patient Measurements: Height: 5' 3 (160 cm) Weight: 60.6 kg (133 lb 9.6 oz) IBW/kg (Calculated) : 52.4 HEPARIN  DW (KG): 60.6  Vital Signs: Temp: 98.1 F (36.7 C) (01/13 1437) Temp Source: Oral (01/13 1437) BP: 148/83 (01/13 1437) Pulse Rate: 87 (01/13 1437)  Labs: Recent Labs    08/31/24 1124 08/31/24 2346 09/01/24 0628 09/01/24 1158  HGB 10.6*  --   --  10.4*  HCT 33.3*  --   --  32.8*  PLT 353  --   --  342  HEPARINUNFRC  --  0.17*  --  0.32  CREATININE 0.57  --  0.47  --     Estimated Creatinine Clearance: 46.4 mL/min (by C-G formula based on SCr of 0.47 mg/dL).  Medications:  Infusions:   heparin  950 Units/hr (09/01/24 0146)    Assessment: 78 yof presented to the ED with CP. To start IV heparin . Baseline Hgb is slightly low but platelets are WNL. She is not on anticoagulation PTA. Heparin  level is now therapeutic. No bleeding noted.   Goal of Therapy:  Heparin  level 0.3-0.7 units/ml Monitor platelets by anticoagulation protocol: Yes   Plan:  Continue heparin  gtt 950units/hr Repeat heparin  level tonight to confirm dosing Daily heparin  level and CBC  Tashan Kreitzer, Vernell Helling 09/01/2024,3:03 PM       [1]  Allergies Allergen Reactions   Demerol [Meperidine] Nausea And Vomiting   Penicillamine     Other reaction(s): Unknown   Valsartan     Other reaction(s): severe jt pain   Penicillins Rash

## 2024-09-01 NOTE — ED Notes (Signed)
 Heparin  level given to courier.

## 2024-09-01 NOTE — ED Notes (Signed)
 Lab called regarding heparin  level, will call Vickie White

## 2024-09-01 NOTE — Plan of Care (Signed)

## 2024-09-01 NOTE — Assessment & Plan Note (Signed)
 Heme globin stable 10.6 Continue iron supplement

## 2024-09-02 ENCOUNTER — Observation Stay (HOSPITAL_BASED_OUTPATIENT_CLINIC_OR_DEPARTMENT_OTHER)

## 2024-09-02 ENCOUNTER — Other Ambulatory Visit (HOSPITAL_COMMUNITY): Payer: Self-pay

## 2024-09-02 ENCOUNTER — Observation Stay (HOSPITAL_COMMUNITY)

## 2024-09-02 DIAGNOSIS — R079 Chest pain, unspecified: Secondary | ICD-10-CM

## 2024-09-02 DIAGNOSIS — R072 Precordial pain: Secondary | ICD-10-CM

## 2024-09-02 LAB — ECHOCARDIOGRAM COMPLETE
AR max vel: 1.7 cm2
AV Area VTI: 1.69 cm2
AV Area mean vel: 1.58 cm2
AV Mean grad: 6.5 mmHg
AV Peak grad: 12 mmHg
Ao pk vel: 1.73 m/s
Area-P 1/2: 3.31 cm2
Height: 63 in
S' Lateral: 2.6 cm
Weight: 2137.58 [oz_av]

## 2024-09-02 LAB — CBC
HCT: 30.9 % — ABNORMAL LOW (ref 36.0–46.0)
Hemoglobin: 9.8 g/dL — ABNORMAL LOW (ref 12.0–15.0)
MCH: 25.4 pg — ABNORMAL LOW (ref 26.0–34.0)
MCHC: 31.7 g/dL (ref 30.0–36.0)
MCV: 80.1 fL (ref 80.0–100.0)
Platelets: 325 K/uL (ref 150–400)
RBC: 3.86 MIL/uL — ABNORMAL LOW (ref 3.87–5.11)
RDW: 16.7 % — ABNORMAL HIGH (ref 11.5–15.5)
WBC: 4.7 K/uL (ref 4.0–10.5)
nRBC: 0 % (ref 0.0–0.2)

## 2024-09-02 LAB — BASIC METABOLIC PANEL WITH GFR
Anion gap: 8 (ref 5–15)
BUN: 7 mg/dL — ABNORMAL LOW (ref 8–23)
CO2: 25 mmol/L (ref 22–32)
Calcium: 8.7 mg/dL — ABNORMAL LOW (ref 8.9–10.3)
Chloride: 106 mmol/L (ref 98–111)
Creatinine, Ser: 0.56 mg/dL (ref 0.44–1.00)
GFR, Estimated: >60 mL/min
Glucose, Bld: 82 mg/dL (ref 70–99)
Potassium: 3.8 mmol/L (ref 3.5–5.1)
Sodium: 140 mmol/L (ref 135–145)

## 2024-09-02 LAB — HEMOGLOBIN A1C
Hgb A1c MFr Bld: 5 % (ref 4.8–5.6)
Mean Plasma Glucose: 96.8 mg/dL

## 2024-09-02 LAB — MAGNESIUM: Magnesium: 2 mg/dL (ref 1.7–2.4)

## 2024-09-02 MED ORDER — IOHEXOL 350 MG/ML SOLN
95.0000 mL | Freq: Once | INTRAVENOUS | Status: AC | PRN
  Administered 2024-09-02: 95 mL via INTRAVENOUS

## 2024-09-02 MED ORDER — PERFLUTREN LIPID MICROSPHERE
1.0000 mL | INTRAVENOUS | Status: AC | PRN
  Administered 2024-09-02: 7 mL via INTRAVENOUS

## 2024-09-02 MED ORDER — AMLODIPINE BESYLATE 5 MG PO TABS
5.0000 mg | ORAL_TABLET | Freq: Every day | ORAL | 2 refills | Status: AC
  Filled 2024-09-02: qty 90, 90d supply, fill #0

## 2024-09-02 MED ORDER — METOPROLOL TARTRATE 50 MG PO TABS
100.0000 mg | ORAL_TABLET | Freq: Once | ORAL | Status: AC
  Administered 2024-09-02: 100 mg via ORAL
  Filled 2024-09-02: qty 2

## 2024-09-02 MED ORDER — NITROGLYCERIN 0.4 MG SL SUBL
0.8000 mg | SUBLINGUAL_TABLET | Freq: Once | SUBLINGUAL | Status: AC
  Administered 2024-09-02: 0.8 mg via SUBLINGUAL
  Filled 2024-09-02: qty 2

## 2024-09-02 MED ORDER — NITROGLYCERIN 0.4 MG SL SUBL
SUBLINGUAL_TABLET | SUBLINGUAL | Status: AC
  Filled 2024-09-02: qty 2

## 2024-09-02 NOTE — Plan of Care (Signed)
" °  Problem: Education: Goal: Knowledge of General Education information will improve Description: Including pain rating scale, medication(s)/side effects and non-pharmacologic comfort measures Outcome: Progressing   Problem: Clinical Measurements: Goal: Ability to maintain clinical measurements within normal limits will improve Outcome: Progressing   Problem: Clinical Measurements: Goal: Respiratory complications will improve Outcome: Progressing   Problem: Clinical Measurements: Goal: Diagnostic test results will improve Outcome: Progressing   Problem: Clinical Measurements: Goal: Cardiovascular complication will be avoided Outcome: Progressing   Problem: Activity: Goal: Risk for activity intolerance will decrease Outcome: Progressing   Problem: Nutrition: Goal: Adequate nutrition will be maintained Outcome: Progressing   Problem: Coping: Goal: Level of anxiety will decrease Outcome: Progressing   Problem: Elimination: Goal: Will not experience complications related to bowel motility Outcome: Progressing   "

## 2024-09-02 NOTE — TOC Transition Note (Signed)
 Transition of Care [facility]) - Discharge Note   Patient Details  Name: Vickie White MRN: [id number] Date of Birth: 20-Jun-1946  [facility] CM/SW Contact:  Roxie KANDICE Stain, RN Phone Number: 09/02/2024, 4:06 PM   Clinical Narrative:    Vickie White is stable to discharge home. Follow up apt on [facility]. No ICM (Inpatient Care Management) needs at this time.    Final next level of care: Home/Self Care Barriers to Discharge: Barriers Resolved   Patient Goals and CMS Choice Patient states their goals for this hospitalization and ongoing recovery are:: return home          Discharge Placement               Home        Discharge Plan and Services Additional resources added to the After Visit Summary for                                       Social Drivers of Health (SDOH) Interventions SDOH Screenings   Food Insecurity: No Food Insecurity (09/01/2024)  Housing: Low Risk (09/01/2024)  Transportation Needs: No Transportation Needs (09/01/2024)  Utilities: Not At Risk (09/01/2024)  Social Connections: Moderately Integrated (09/01/2024)  Tobacco Use: Low Risk (08/31/2024)     Readmission Risk Interventions     No data to display

## 2024-09-02 NOTE — Progress Notes (Signed)
 "  Progress Note  Patient Name: Vickie White Date of Encounter: 09/02/2024  Primary Cardiologist: None   Subjective   Doing well today. No further chest pain. Says she has a history of a hiatal hernia.  Inpatient Medications    Scheduled Meds:  amLODipine   5 mg Oral Daily   aspirin  EC  81 mg Oral Daily   atorvastatin   20 mg Oral Daily   pantoprazole   40 mg Oral Daily   Continuous Infusions:  PRN Meds: acetaminophen , nitroGLYCERIN , ondansetron  (ZOFRAN ) IV   Vital Signs    Vitals:   09/01/24 2200 09/02/24 0000 09/02/24 0400 09/02/24 0627  BP: 125/79 130/80  (!) 131/58  Pulse: 76   70  Resp: 18   18  Temp: 97.9 F (36.6 C)   97.8 F (36.6 C)  TempSrc: Oral   Oral  SpO2: 92% 92% 93% 96%  Weight:      Height:        Intake/Output Summary (Last 24 hours) at 09/02/2024 0731 Last data filed at 09/01/2024 2035 Gross per 24 hour  Intake 784.45 ml  Output --  Net 784.45 ml   Filed Weights   08/31/24 1116 09/01/24 1437  Weight: 62.5 kg 60.6 kg    Telemetry    NSR with PVCs - Personally Reviewed  ECG    Normal sinus rhythm with PACs- Personally Reviewed  Physical Exam   Physical Exam Vitals and nursing note reviewed.  Constitutional:      Appearance: Normal appearance.  HENT:     Head: Normocephalic and atraumatic.  Eyes:     Conjunctiva/sclera: Conjunctivae normal.  Neck:     Vascular: No carotid bruit.  Cardiovascular:     Rate and Rhythm: Normal rate and regular rhythm.  Pulmonary:     Effort: Pulmonary effort is normal.     Breath sounds: Normal breath sounds.  Musculoskeletal:        General: No swelling or tenderness.  Skin:    Coloration: Skin is not jaundiced or pale.  Neurological:     Mental Status: She is alert.      Labs    Chemistry Recent Labs  Lab 08/28/24 0947 08/31/24 1124 09/01/24 0628 09/02/24 0233  NA 139 140 140 140  K 3.8 3.4* 3.4* 3.8  CL 102 105 105 106  CO2 23 25 27 25   GLUCOSE 82 82 86 82  BUN 8 10 8   7*  CREATININE 0.63 0.57 0.47 0.56  CALCIUM  8.9 9.2 8.7* 8.7*  PROT 6.4  --   --   --   ALBUMIN 3.8  --   --   --   AST 11  --   --   --   ALT 6  --   --   --   ALKPHOS 93  --   --   --   BILITOT 0.5  --   --   --   GFRNONAA  --  >60 >60 >60  ANIONGAP  --  11 8 8      Hematology Recent Labs  Lab 08/31/24 1124 09/01/24 1158 09/02/24 0233  WBC 7.1 6.0 4.7  RBC 4.15 4.14 3.86*  HGB 10.6* 10.4* 9.8*  HCT 33.3* 32.8* 30.9*  MCV 80.2 79.2* 80.1  MCH 25.5* 25.1* 25.4*  MCHC 31.8 31.7 31.7  RDW 17.0* 16.9* 16.7*  PLT 353 342 325    Cardiac EnzymesNo results for input(s): TROPONINI in the last 168 hours. No results for input(s): TROPIPOC in the last  168 hours.   BNPNo results for input(s): BNP, PROBNP in the last 168 hours.   DDimer No results for input(s): DDIMER in the last 168 hours.   Radiology    DG Chest 2 View Result Date: 08/31/2024 EXAM: 2 VIEW(S) XRAY OF THE CHEST 08/31/2024 01:08:00 PM COMPARISON: 02/11/2023 CLINICAL HISTORY: chest pressure FINDINGS: LUNGS AND PLEURA: No focal pulmonary opacity. No pleural effusion. No pneumothorax. HEART AND MEDIASTINUM: No acute abnormality of the cardiac and mediastinal silhouettes. BONES AND SOFT TISSUES: No acute osseous abnormality. IMPRESSION: 1. No acute cardiopulmonary abnormality. Electronically signed by: Waddell Calk MD MD 08/31/2024 02:55 PM EST RP Workstation: [id number]    Cardiac Studies   Coronary CTA 02/16/2022: IMPRESSION: 1. Coronary calcium  score of 0.   2. Normal coronary origin with right dominance.   3. 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 Patient has been having chest pain upon awakening for the past 2 weeks which has been improving after taking nitroglycerin .  Has not had any exertional  component.  Assessment & Plan   Atypical chest pain, possibly related to hypertension-EKG without pathologic ST changes and troponin negative x 3.  Coronary calcium  score of 0 in 2023 with nonobstructive disease.  No need for heparin  right now.  Continue with aspirin  81 mg.  Coronary CTA today and echocardiogram.  If no concerning findings patient can be discharged with outpatient follow-up. Uncontrolled hypertension-started on amlodipine  5 mg with significantly improved blood pressure. Nonobstructive CAD by CT  If coronary CTA and echocardiogram are unremarkable and patient can be discharged on amlodipine  with outpatient follow-up.     For questions or updates, please contact [facility] Please consult [email or web address] for contact info under        Signed, Emeline Calender, DO 09/02/2024, 7:31 AM    "

## 2024-09-02 NOTE — Care Management Obs Status (Signed)
 MEDICARE OBSERVATION STATUS NOTIFICATION   Patient Details  Name: Vickie White MRN: [id number] Date of Birth: 08/06/1946   Medicare Observation Status Notification Given:  Yes  Obs letter signed and copy given   Claretta Deed 09/02/2024, 12:17 PM

## 2024-09-02 NOTE — Plan of Care (Signed)
" °  Problem: Education: Goal: Knowledge of General Education information will improve Description: Including pain rating scale, medication(s)/side effects and non-pharmacologic comfort measures Outcome: Progressing   Problem: Health Behavior/Discharge Planning: Goal: Ability to manage health-related needs will improve Outcome: Progressing   Problem: Clinical Measurements: Goal: Respiratory complications will improve Outcome: Progressing Goal: Cardiovascular complication will be avoided Outcome: Progressing   Problem: Nutrition: Goal: Adequate nutrition will be maintained Outcome: Progressing   Problem: Elimination: Goal: Will not experience complications related to bowel motility Outcome: Progressing Goal: Will not experience complications related to urinary retention Outcome: Progressing   Problem: Pain Managment: Goal: General experience of comfort will improve and/or be controlled Outcome: Progressing   Problem: Activity: Goal: Ability to tolerate increased activity will improve Outcome: Progressing   "

## 2024-09-02 NOTE — Progress Notes (Addendum)
 Patient provided with verbal discharge instructions. Paper copy of discharge provided to patient. RN answered all questions. VSS at discharge. IV removed. Patient belongings sent with patient. TOC medication brought to bedside. Patient dc'd via wheelchair to private vehicle

## 2024-09-02 NOTE — Hospital Course (Signed)
 Vickie White is a 79 y.o. female with medical history significant of  HTN, HLD, RA not on treatment CAD with angina who presented to [facility] ED for evaluation of chest pain.   Patient reports that after getting up past couple mornings after she has been up for about 1 hour, she will feel tightness and pressure in her chest.  Yesterday morning she took sublingual nitroglycerin  and it helped, this morning she did the same but did not get relief, so she presented to the ED for evaluation.  She denies sharp or stabbing pain, states it feels like a lot of pressure, moderate in severity.   Pain does not radiate.  She denies associated shortness of breath, dizziness or lightheadedness, nausea or diaphoresis.  Patient reports being seen by her cardiologist on December 31, was advised to take baby aspirin  daily and continue using nitroglycerin  as needed, a CT coronary angio was scheduled for later this month.  Patient denies history of smoking or family history of cardiovascular disease.  Patient reports otherwise recently being in good health with no recent fever/chills, sore throat cough or congestion, no abdominal pain nausea vomiting diarrhea or UTI symptoms.     On workup she was found to have stable vital signs except for elevated blood pressure, 181/92. EKG was negative for ischemic changes.  Troponins were trended and remained negative. Cardiology was consulted on admission for further workup assistance.  Initially patient was started on a heparin  drip and she was recommended to undergo CT coronary scan and repeat echo.  Findings of CT or reassuring for CAD.  Coronary calcium  score 0.916, 13th percentile. Echo showed normal EF, 55 to 60%, no RWMA, grade 1 DD.  She is recommended to continue on amlodipine  for blood pressure control per cardiology at discharge.

## 2024-09-02 NOTE — Discharge Summary (Signed)
 " Physician Discharge Summary   Vickie White FMW:[id number] DOB: 1946/03/25 DOA: 08/31/2024  PCP: Georgia Norleen FALCON., MD  Admit date: 08/31/2024 Discharge date: 09/02/2024  Admitted From: Home Disposition:  Home Discharging physician: Alm Apo, MD Barriers to discharge: none  Recommendations at discharge: Follow up with cardiology   Discharge Condition: stable CODE STATUS: Full  Diet recommendation:  Diet Orders (From admission, onward)     Start     Ordered   09/02/24 0000  Diet - low sodium heart healthy        09/02/24 1503   09/01/24 1314  Diet Heart Room service appropriate? Yes; Fluid consistency: Thin  Diet effective now       Question Answer Comment  Room service appropriate? Yes   Fluid consistency: Thin      09/01/24 1314            Hospital Course: Vickie White is a 79 y.o. female with medical history significant of  HTN, HLD, RA not on treatment CAD with angina who presented to [facility] ED for evaluation of chest pain.   Patient reports that after getting up past couple mornings after she has been up for about 1 hour, she will feel tightness and pressure in her chest.  Yesterday morning she took sublingual nitroglycerin  and it helped, this morning she did the same but did not get relief, so she presented to the ED for evaluation.  She denies sharp or stabbing pain, states it feels like a lot of pressure, moderate in severity.   Pain does not radiate.  She denies associated shortness of breath, dizziness or lightheadedness, nausea or diaphoresis.  Patient reports being seen by her cardiologist on December 31, was advised to take baby aspirin  daily and continue using nitroglycerin  as needed, a CT coronary angio was scheduled for later this month.  Patient denies history of smoking or family history of cardiovascular disease.  Patient reports otherwise recently being in good health with no recent fever/chills, sore throat cough or congestion, no abdominal pain nausea  vomiting diarrhea or UTI symptoms.     On workup she was found to have stable vital signs except for elevated blood pressure, 181/92. EKG was negative for ischemic changes.  Troponins were trended and remained negative. Cardiology was consulted on admission for further workup assistance.  Initially patient was started on a heparin  drip and she was recommended to undergo CT coronary scan and repeat echo.  Findings of CT or reassuring for CAD.  Coronary calcium  score 0.916, 13th percentile. Echo showed normal EF, 55 to 60%, no RWMA, grade 1 DD.  She is recommended to continue on amlodipine  for blood pressure control per cardiology at discharge.  Assessment and Plan: * Chest pain-resolved as of 09/02/2024 Patient having persistent anginal chest pain and requiring repeated sublingual nitroglycerin  doses.  Troponin remains negative x 3.  EKGs have been nonacute.  Outpatient coronary CTA was planned for later this month for evaluation of angina that she was seen by her cardiologist for on 12/31.  Prior coronary CT back in June 2023 calcium  score was 0. --Cardiology is consulted --Continue IV heparin  --Monitor on telemetry --Sublingual nitroglycerin  as needed --Continue aspirin  81 mg daily --Repeat EKG and troponin for active chest pain  Mixed dyslipidemia Recent lipid panel on 08/28/2024 showed total cholesterol 206, LDL 121, HDL 77, triglycerides 43.  Not yet on statin. -- Start Lipitor 20 mg daily  Iron deficiency anemia Heme globin stable 10.6 Continue iron supplement  Rheumatoid  arthritis (HCC) Stable without active flare symptoms reported.  Not on medications.  Monitor.  GERD (gastroesophageal reflux disease) Continue PPI  Angina pectoris See chest pain    The patient's acute and chronic medical conditions were treated accordingly. On day of discharge, patient was felt deemed stable for discharge. Patient/family member advised to call PCP or come back to ER if needed.    Principal Diagnosis: Chest pain  Discharge Diagnoses: Active Hospital Problems   Diagnosis Date Noted   Mixed dyslipidemia 08/19/2024    Priority: Low   GERD (gastroesophageal reflux disease) 01/09/2022    Priority: Low   Iron deficiency anemia 01/09/2022    Priority: Low   Rheumatoid arthritis (HCC) 01/09/2022    Priority: Low   Angina pectoris 08/19/2024    Resolved Hospital Problems   Diagnosis Date Noted Date Resolved   Chest pain 02/05/2020 09/02/2024    Priority: High     Discharge Instructions     Diet - low sodium heart healthy   Complete by: As directed    Increase activity slowly   Complete by: As directed       Allergies as of 09/02/2024       Reactions   Demerol [meperidine] Nausea And Vomiting   Penicillamine    Other reaction(s): Unknown   Valsartan    Other reaction(s): severe jt pain   Penicillins Rash        Medication List     TAKE these medications    amLODipine  5 MG tablet Commonly known as: NORVASC  Take 1 tablet (5 mg total) by mouth daily. Start taking on: September 03, 2024   aspirin  EC 81 MG tablet Take 1 tablet (81 mg total) by mouth daily. Swallow whole.   IRON PO Take 1 tablet by mouth daily.   multivitamin tablet Take 1 tablet by mouth daily.   nitroGLYCERIN  0.4 MG SL tablet Commonly known as: NITROSTAT  Place 1 tablet (0.4 mg total) under the tongue every 5 (five) minutes as needed.   omeprazole 20 MG capsule Commonly known as: PRILOSEC Take 20 mg by mouth as needed for other. Hiatal hernia        Allergies[1]  Consultations: Cardiology  Procedures:   Discharge Exam: BP (!) 141/68   Pulse 62   Temp 97.7 F (36.5 C) (Oral)   Resp 17   Ht 5' 3 (1.6 m)   Wt 60.6 kg   SpO2 97%   BMI 23.67 kg/m  Physical Exam Constitutional:      Appearance: Normal appearance.  HENT:     Head: Normocephalic and atraumatic.     Mouth/Throat:     Mouth: Mucous membranes are moist.  Eyes:     Extraocular  Movements: Extraocular movements intact.  Cardiovascular:     Rate and Rhythm: Normal rate and regular rhythm.  Pulmonary:     Effort: Pulmonary effort is normal. No respiratory distress.     Breath sounds: Normal breath sounds. No wheezing.  Abdominal:     General: Bowel sounds are normal. There is no distension.     Palpations: Abdomen is soft.     Tenderness: There is no abdominal tenderness.  Musculoskeletal:        General: Normal range of motion.     Cervical back: Normal range of motion and neck supple.  Skin:    General: Skin is warm and dry.  Neurological:     General: No focal deficit present.     Mental Status: She is alert.  Psychiatric:        Mood and Affect: Mood normal.      The results of significant diagnostics from this hospitalization (including imaging, microbiology, ancillary and laboratory) are listed below for reference.   Microbiology: Recent Results (from the past 240 hours)  MRSA Next Gen by PCR, Nasal     Status: None   Collection Time: 09/01/24  2:46 PM   Specimen: Nasal Mucosa; Nasal Swab  Result Value Ref Range Status   MRSA by PCR Next Gen NOT DETECTED NOT DETECTED Final    Comment: (NOTE) The GeneXpert MRSA Assay (FDA approved for NASAL specimens only), is one component of a comprehensive MRSA colonization surveillance program. It is not intended to diagnose MRSA infection nor to guide or monitor treatment for MRSA infections. Test performance is not FDA approved in patients less than 30 years old. Performed at [facility], [address] [place], [place] [address]      Labs: BNP (last 3 results) No results for input(s): BNP in the last 8760 hours. Basic Metabolic Panel: Recent Labs  Lab 08/28/24 0947 08/31/24 1124 09/01/24 0628 09/02/24 0233  NA 139 140 140 140  K 3.8 3.4* 3.4* 3.8  CL 102 105 105 106  CO2 23 25 27 25   GLUCOSE 82 82 86 82  BUN 8 10 8  7*  CREATININE 0.63 0.57 0.47 0.56  CALCIUM  8.9 9.2 8.7* 8.7*   MG  --   --   --  2.0   Liver Function Tests: Recent Labs  Lab 08/28/24 0947  AST 11  ALT 6  ALKPHOS 93  BILITOT 0.5  PROT 6.4  ALBUMIN 3.8   No results for input(s): LIPASE, AMYLASE in the last 168 hours. No results for input(s): AMMONIA in the last 168 hours. CBC: Recent Labs  Lab 08/28/24 0947 08/31/24 1124 09/01/24 1158 09/02/24 0233  WBC 6.8 7.1 6.0 4.7  HGB 10.5* 10.6* 10.4* 9.8*  HCT 32.4* 33.3* 32.8* 30.9*  MCV 80 80.2 79.2* 80.1  PLT 313 353 342 325   Cardiac Enzymes: No results for input(s): CKTOTAL, CKMB, CKMBINDEX, TROPONINI in the last 168 hours. BNP: Invalid input(s): POCBNP CBG: No results for input(s): GLUCAP in the last 168 hours. D-Dimer No results for input(s): DDIMER in the last 72 hours. Hgb A1c Recent Labs    09/01/24 2048  HGBA1C 5.0   Lipid Profile Recent Labs    09/01/24 2048  CHOL 191  HDL 63  LDLCALC 114*  TRIG 69  CHOLHDL 3.0   Thyroid function studies No results for input(s): TSH, T4TOTAL, T3FREE, THYROIDAB in the last 72 hours.  Invalid input(s): FREET3 Anemia work up No results for input(s): VITAMINB12, FOLATE, FERRITIN, TIBC, IRON, RETICCTPCT in the last 72 hours. Urinalysis No results found for: COLORURINE, APPEARANCEUR, LABSPEC, PHURINE, GLUCOSEU, HGBUR, BILIRUBINUR, KETONESUR, PROTEINUR, UROBILINOGEN, NITRITE, LEUKOCYTESUR Sepsis Labs Recent Labs  Lab 08/28/24 0947 08/31/24 1124 09/01/24 1158 09/02/24 0233  WBC 6.8 7.1 6.0 4.7   Component Ref Range & Units (hover) 1 d ago (09/01/24) 2 d ago (08/31/24) 2 d ago (08/31/24)  Troponin T High Sensitivity <15 <15 CM <15 CM    Microbiology Recent Results (from the past 240 hours)  MRSA Next Gen by PCR, Nasal     Status: None   Collection Time: 09/01/24  2:46 PM   Specimen: Nasal Mucosa; Nasal Swab  Result Value Ref Range Status   MRSA by PCR Next Gen NOT DETECTED NOT DETECTED Final  Comment:  (NOTE) The GeneXpert MRSA Assay (FDA approved for NASAL specimens only), is one component of a comprehensive MRSA colonization surveillance program. It is not intended to diagnose MRSA infection nor to guide or monitor treatment for MRSA infections. Test performance is not FDA approved in patients less than 28 years old. Performed at [facility], [address] [place], [place] [address]     Procedures/Studies: CT CORONARY MORPH W/CTA COR W/SCORE W/CA W/CM &/OR WO/CM Result Date: 09/02/2024 CLINICAL DATA:  Chest pain EXAM: Cardiac/Coronary CTA TECHNIQUE: A non-contrast, gated CT scan was obtained with axial slices of 3 mm through the heart for calcium  scoring. Calcium  scoring was performed using the Agatston method. A 120 kV prospective, gated, contrast cardiac scan was obtained. Gantry rotation speed was 250 msecs and collimation was 0.6 mm. Two sublingual nitroglycerin  tablets (0.8 mg) were given. The 3D data set was reconstructed in 5% intervals of the 35-75% of the R-R cycle. Diastolic phases were analyzed on a dedicated workstation using MPR, MIP, and VRT modes. The patient received 95 cc of contrast. FINDINGS: Image quality: Good Noise artifact is: Significant slab artifact. Coronary Arteries:  Normal coronary origin.  Right dominance. Left main: The left main is a large caliber vessel with a normal take off from the left coronary cusp that bifurcates to form a left anterior descending artery and a left circumflex artery. There is no plaque or stenosis. Left anterior descending artery: The LAD is patent without evidence of plaque or stenosis. The LAD gives off 2 small patent diagonal branches. Left circumflex artery: The LCX is non-dominant and gives off 2 patent obtuse marginal branches. Study limited in detection for significant soft plaque due to slab artifact. Right coronary artery: The RCA is dominant with normal take off from the right coronary cusp. There is no evidence of plaque or  stenosis. The RCA terminates as a PDA and right posterolateral branch without evidence of plaque or stenosis. Right Atrium: Right atrial size is within normal limits. Right Ventricle: The right ventricular cavity is within normal limits. Left Atrium: Left atrial size is normal in size with no left atrial appendage filling defect. Left Ventricle: The ventricular cavity size is within normal limits. Pulmonary arteries: Normal in size. Pulmonary veins: Normal pulmonary venous drainage. Pericardium: Normal thickness without significant effusion or calcium  present. Cardiac valves: The aortic valve is trileaflet without significant calcification. The mitral valve is normal without significant calcification. Aorta: Normal caliber without significant disease. Extra-cardiac findings: See attached radiology report for non-cardiac structures. IMPRESSION: 1. Coronary calcium  score of 0.916. This was 13th percentile for age-, sex, and race-matched controls. 2. Normal coronary origin with right dominance. 3. Normal coronary arteries. Study limited for detection of soft plaque due to significant slab artifact. No calcified plaque present. RECOMMENDATIONS: 1. CAD-RADS 0: No evidence of CAD (0%). Consider non-atherosclerotic causes of chest pain. 2. CAD-RADS 1: Minimal non-obstructive CAD (0-24%). Consider non-atherosclerotic causes of chest pain. Consider preventive therapy and risk factor modification. 3. CAD-RADS 2: Mild non-obstructive CAD (25-49%). Consider non-atherosclerotic causes of chest pain. Consider preventive therapy and risk factor modification. 4. CAD-RADS 3: Moderate stenosis. Consider symptom-guided anti-ischemic pharmacotherapy as well as risk factor modification per guideline directed care. Additional analysis with CT FFR will be submitted. 5. CAD-RADS 4: Severe stenosis. (70-99% or > 50% left main). Cardiac catheterization or CT FFR is recommended. Consider symptom-guided anti-ischemic pharmacotherapy as well as  risk factor modification per guideline directed care. Invasive coronary angiography recommended with revascularization per  published guideline statements. 6. CAD-RADS 5: Total coronary occlusion (100%). Consider cardiac catheterization or viability assessment. Consider symptom-guided anti-ischemic pharmacotherapy as well as risk factor modification per guideline directed care. 7. CAD-RADS N: Non-diagnostic study. Obstructive CAD can't be excluded. Alternative evaluation is recommended. Wilbert Bihari, MD Electronically Signed   By: Wilbert Bihari M.D.   On: 09/02/2024 14:16   ECHOCARDIOGRAM COMPLETE Result Date: 09/02/2024    ECHOCARDIOGRAM REPORT   Patient Name:   Vickie White Date of Exam: 09/02/2024 Medical Rec #:  [id number]         Height:       63.0 in Accession #:    [id number]        Weight:       133.6 lb Date of Birth:  10-13-1945          BSA:          1.629 m Patient Age:    78 years          BP:           144/74 mmHg Patient Gender: F                 HR:           61 bpm. Exam Location:  Inpatient Procedure: 2D Echo, Cardiac Doppler, Color Doppler and Intracardiac            Opacification Agent (Both Spectral and Color Flow Doppler were            utilized during procedure). Indications:    Chest Pain  History:        Patient has no prior history of Echocardiogram examinations.                 Signs/Symptoms:Chest Pain; Risk Factors:Dyslipidemia.  Sonographer:    Sherlean Dubin Referring Phys: [id number] SHIRL FRUITS  Sonographer Comments: Image acquisition challenging due to respiratory motion. IMPRESSIONS  1. Left ventricular ejection fraction, by estimation, is 55 to 60%. The left ventricle has normal function. The left ventricle has no regional wall motion abnormalities. Left ventricular diastolic parameters are consistent with Grade I diastolic dysfunction (impaired relaxation).  2. Right ventricular systolic function is normal. The right ventricular size is normal.  3. The mitral valve is normal in  structure. No evidence of mitral valve regurgitation. No evidence of mitral stenosis.  4. The aortic valve is tricuspid. Aortic valve regurgitation is not visualized. No aortic stenosis is present.  5. The inferior vena cava is normal in size with greater than 50% respiratory variability, suggesting right atrial pressure of 3 mmHg. Comparison(s): No prior Echocardiogram. FINDINGS  Left Ventricle: Left ventricular ejection fraction, by estimation, is 55 to 60%. The left ventricle has normal function. The left ventricle has no regional wall motion abnormalities. Definity  contrast agent was given IV to delineate the left ventricular  endocardial borders. The left ventricular internal cavity size was normal in size. There is no left ventricular hypertrophy. Left ventricular diastolic parameters are consistent with Grade I diastolic dysfunction (impaired relaxation). Right Ventricle: The right ventricular size is normal. Right ventricular systolic function is normal. Left Atrium: Left atrial size was normal in size. Right Atrium: Right atrial size was normal in size. Pericardium: There is no evidence of pericardial effusion. Mitral Valve: The mitral valve is normal in structure. No evidence of mitral valve regurgitation. No evidence of mitral valve stenosis. Tricuspid Valve: The tricuspid valve is normal in structure. Tricuspid valve regurgitation is trivial. No evidence of  tricuspid stenosis. Aortic Valve: The aortic valve is tricuspid. Aortic valve regurgitation is not visualized. No aortic stenosis is present. Pulmonic Valve: The pulmonic valve was normal in structure. Pulmonic valve regurgitation is not visualized. No evidence of pulmonic stenosis. Aorta: The aortic root is normal in size and structure. Venous: The inferior vena cava is normal in size with greater than 50% respiratory variability, suggesting right atrial pressure of 3 mmHg. IAS/Shunts: No atrial level shunt detected by color flow Doppler.  LEFT  VENTRICLE PLAX 2D LVIDd:         3.80 cm   Diastology LVIDs:         2.60 cm   LV e' medial:    6.86 cm/s LV PW:         0.90 cm   LV E/e' medial:  10.8 LV IVS:        1.00 cm   LV e' lateral:   7.61 cm/s LVOT diam:     2.00 cm   LV E/e' lateral: 9.8 LV SV:         64 LV SV Index:   39 LVOT Area:     3.14 cm  RIGHT VENTRICLE             IVC RV Basal diam:  3.75 cm     IVC diam: 1.70 cm RV Mid diam:    2.60 cm RV S prime:     11.50 cm/s TAPSE (M-mode): 1.9 cm LEFT ATRIUM             Index        RIGHT ATRIUM           Index LA diam:        3.40 cm 2.09 cm/m   RA Area:     14.80 cm LA Vol (A2C):   39.5 ml 24.25 ml/m  RA Volume:   36.10 ml  22.16 ml/m LA Vol (A4C):   58.9 ml 36.16 ml/m LA Biplane Vol: 48.3 ml 29.65 ml/m  AORTIC VALVE AV Area (Vmax):    1.70 cm AV Area (Vmean):   1.58 cm AV Area (VTI):     1.69 cm AV Vmax:           173.00 cm/s AV Vmean:          117.000 cm/s AV VTI:            0.378 m AV Peak Grad:      12.0 mmHg AV Mean Grad:      6.5 mmHg LVOT Vmax:         93.80 cm/s LVOT Vmean:        58.700 cm/s LVOT VTI:          0.204 m LVOT/AV VTI ratio: 0.54  AORTA Ao Root diam: 2.80 cm Ao Asc diam:  3.00 cm MITRAL VALVE               TRICUSPID VALVE MV Area (PHT): 3.31 cm    TR Peak grad:   8.2 mmHg MV Decel Time: 229 msec    TR Vmax:        143.00 cm/s MV E velocity: 74.40 cm/s MV A velocity: 98.60 cm/s  SHUNTS MV E/A ratio:  0.75        Systemic VTI:  0.20 m                            Systemic Diam: 2.00 cm Redell  Crenshaw MD Electronically signed by Redell Shallow MD Signature Date/Time: 09/02/2024/1:40:25 PM    Final    DG Chest 2 View Result Date: 08/31/2024 EXAM: 2 VIEW(S) XRAY OF THE CHEST 08/31/2024 01:08:00 PM COMPARISON: 02/11/2023 CLINICAL HISTORY: chest pressure FINDINGS: LUNGS AND PLEURA: No focal pulmonary opacity. No pleural effusion. No pneumothorax. HEART AND MEDIASTINUM: No acute abnormality of the cardiac and mediastinal silhouettes. BONES AND SOFT TISSUES: No acute osseous  abnormality. IMPRESSION: 1. No acute cardiopulmonary abnormality. Electronically signed by: Waddell Calk MD MD 08/31/2024 02:55 PM EST RP Workstation: [id number]     Time coordinating discharge: Over 30 minutes    Alm Apo, MD  [facility] 09/02/2024, 3:47 PM    [1]  Allergies Allergen Reactions   Demerol [Meperidine] Nausea And Vomiting   Penicillamine     Other reaction(s): Unknown   Valsartan     Other reaction(s): severe jt pain   Penicillins Rash   "

## 2024-09-02 NOTE — TOC CM/SW Note (Signed)
 Transition of Care [facility]) - Inpatient Brief Assessment   Patient Details  Name: Vickie White MRN: [id number] Date of Birth: Jun 28, 1946  [facility] of [facility]) CM/SW Contact:    Roxie KANDICE Stain, RN Phone Number: 09/02/2024, 11:48 AM   Clinical Narrative:  Patient may discharge today if coronary CT is negative.  ICM (Inpatient Care Management) will continue to follow, no needs anticipated at this time.   Transition of Care Asessment: Insurance and Status: Insurance coverage has been reviewed Patient has primary care physician: Yes Home environment has been reviewed: safe to discharge home Prior level of function:: independent Prior/Current Home Services: No current home services Social Drivers of Health Review: SDOH reviewed no interventions necessary Readmission risk has been reviewed: Yes Transition of care needs: no transition of care needs at this time

## 2024-09-16 ENCOUNTER — Ambulatory Visit: Admitting: Cardiology

## 2024-09-17 ENCOUNTER — Other Ambulatory Visit (HOSPITAL_BASED_OUTPATIENT_CLINIC_OR_DEPARTMENT_OTHER): Admitting: Radiology
